# Patient Record
Sex: Female | Born: 1970 | Race: White | Hispanic: No | Smoking: Current every day smoker
Health system: Southern US, Community
[De-identification: ages and names within clinical notes are randomized; demographics above are authoritative.]

## PROBLEM LIST (undated history)

## (undated) DIAGNOSIS — M549 Dorsalgia, unspecified: Secondary | ICD-10-CM

## (undated) HISTORY — PX: OTHER SURGICAL HISTORY: SHX169

## (undated) HISTORY — DX: Dorsalgia, unspecified: M54.9

## (undated) HISTORY — PX: APPENDECTOMY: SHX54

---

## 1997-08-08 ENCOUNTER — Other Ambulatory Visit: Admission: RE | Admit: 1997-08-08 | Discharge: 1997-08-08 | Payer: Self-pay | Admitting: Obstetrics & Gynecology

## 1997-10-29 ENCOUNTER — Other Ambulatory Visit: Admission: RE | Admit: 1997-10-29 | Discharge: 1997-10-29 | Payer: Self-pay | Admitting: Obstetrics & Gynecology

## 1998-11-06 ENCOUNTER — Other Ambulatory Visit: Admission: RE | Admit: 1998-11-06 | Discharge: 1998-11-06 | Payer: Self-pay | Admitting: Obstetrics & Gynecology

## 1999-11-12 ENCOUNTER — Other Ambulatory Visit: Admission: RE | Admit: 1999-11-12 | Discharge: 1999-11-12 | Payer: Self-pay | Admitting: Obstetrics & Gynecology

## 2000-11-22 ENCOUNTER — Other Ambulatory Visit: Admission: RE | Admit: 2000-11-22 | Discharge: 2000-11-22 | Payer: Self-pay | Admitting: Obstetrics & Gynecology

## 2001-12-03 ENCOUNTER — Other Ambulatory Visit: Admission: RE | Admit: 2001-12-03 | Discharge: 2001-12-03 | Payer: Self-pay | Admitting: Obstetrics & Gynecology

## 2002-12-06 ENCOUNTER — Other Ambulatory Visit: Admission: RE | Admit: 2002-12-06 | Discharge: 2002-12-06 | Payer: Self-pay | Admitting: Obstetrics & Gynecology

## 2003-06-13 ENCOUNTER — Inpatient Hospital Stay (HOSPITAL_COMMUNITY): Admission: AD | Admit: 2003-06-13 | Discharge: 2003-06-18 | Payer: Self-pay | Admitting: Obstetrics and Gynecology

## 2003-11-26 ENCOUNTER — Emergency Department (HOSPITAL_COMMUNITY): Admission: EM | Admit: 2003-11-26 | Discharge: 2003-11-26 | Payer: Self-pay | Admitting: Emergency Medicine

## 2003-12-09 ENCOUNTER — Other Ambulatory Visit: Admission: RE | Admit: 2003-12-09 | Discharge: 2003-12-09 | Payer: Self-pay | Admitting: Obstetrics & Gynecology

## 2004-12-29 ENCOUNTER — Other Ambulatory Visit: Admission: RE | Admit: 2004-12-29 | Discharge: 2004-12-29 | Payer: Self-pay | Admitting: Obstetrics & Gynecology

## 2005-07-06 ENCOUNTER — Inpatient Hospital Stay (HOSPITAL_COMMUNITY): Admission: AD | Admit: 2005-07-06 | Discharge: 2005-07-08 | Payer: Self-pay | Admitting: Obstetrics and Gynecology

## 2005-08-08 ENCOUNTER — Other Ambulatory Visit: Admission: RE | Admit: 2005-08-08 | Discharge: 2005-08-08 | Payer: Self-pay | Admitting: Obstetrics & Gynecology

## 2005-12-05 ENCOUNTER — Ambulatory Visit: Payer: Self-pay | Admitting: Family Medicine

## 2006-01-06 ENCOUNTER — Ambulatory Visit: Payer: Self-pay | Admitting: Family Medicine

## 2006-07-28 DIAGNOSIS — S322XXA Fracture of coccyx, initial encounter for closed fracture: Secondary | ICD-10-CM

## 2006-07-28 DIAGNOSIS — S3210XA Unspecified fracture of sacrum, initial encounter for closed fracture: Secondary | ICD-10-CM

## 2006-07-28 HISTORY — DX: Unspecified fracture of sacrum, initial encounter for closed fracture: S32.10XA

## 2012-05-11 ENCOUNTER — Ambulatory Visit (INDEPENDENT_AMBULATORY_CARE_PROVIDER_SITE_OTHER): Payer: BC Managed Care – PPO | Admitting: Family Medicine

## 2012-05-11 ENCOUNTER — Encounter: Payer: Self-pay | Admitting: Family Medicine

## 2012-05-11 VITALS — BP 130/80 | HR 90 | Temp 98.5°F | Ht 64.75 in | Wt 164.8 lb

## 2012-05-11 DIAGNOSIS — Z8249 Family history of ischemic heart disease and other diseases of the circulatory system: Secondary | ICD-10-CM

## 2012-05-11 DIAGNOSIS — Z720 Tobacco use: Secondary | ICD-10-CM

## 2012-05-11 DIAGNOSIS — R0982 Postnasal drip: Secondary | ICD-10-CM

## 2012-05-11 DIAGNOSIS — F17200 Nicotine dependence, unspecified, uncomplicated: Secondary | ICD-10-CM

## 2012-05-11 HISTORY — DX: Tobacco use: Z72.0

## 2012-05-11 HISTORY — DX: Postnasal drip: R09.82

## 2012-05-11 HISTORY — DX: Family history of ischemic heart disease and other diseases of the circulatory system: Z82.49

## 2012-05-11 LAB — CBC WITH DIFFERENTIAL/PLATELET
Basophils Relative: 0.3 % (ref 0.0–3.0)
Eosinophils Relative: 1.7 % (ref 0.0–5.0)
HCT: 44 % (ref 36.0–46.0)
Lymphs Abs: 1.3 10*3/uL (ref 0.7–4.0)
Monocytes Relative: 2.9 % — ABNORMAL LOW (ref 3.0–12.0)
Neutrophils Relative %: 79.8 % — ABNORMAL HIGH (ref 43.0–77.0)
Platelets: 192 10*3/uL (ref 150.0–400.0)
RBC: 4.75 Mil/uL (ref 3.87–5.11)
WBC: 8.7 10*3/uL (ref 4.5–10.5)

## 2012-05-11 LAB — HEPATIC FUNCTION PANEL
AST: 16 U/L (ref 0–37)
Albumin: 4.1 g/dL (ref 3.5–5.2)
Alkaline Phosphatase: 61 U/L (ref 39–117)
Total Protein: 7 g/dL (ref 6.0–8.3)

## 2012-05-11 LAB — BASIC METABOLIC PANEL
CO2: 23 mEq/L (ref 19–32)
Glucose, Bld: 141 mg/dL — ABNORMAL HIGH (ref 70–99)
Potassium: 3.6 mEq/L (ref 3.5–5.1)
Sodium: 139 mEq/L (ref 135–145)

## 2012-05-11 LAB — LIPID PANEL: Total CHOL/HDL Ratio: 5

## 2012-05-11 NOTE — Progress Notes (Signed)
  Subjective:    Patient ID: Sara Hunter, female    DOB: [birth year], 42 y.o.   MRN: [id number]  HPI New to establish.  Previous [place]- [place], then moved to [place].  GYNArlyce Dice, UTD on pap/mammo.  Tobacco use- current smoker, currently 1 ppd.  Has used Chantix in the past (2-3x) but will stop taking meds when it makes her sick to smoke.  Will wake in the morning and smoke 1st thing.  Started smoking at age 78.  Friends and family also smoke.  Not currently working so will smoke when bored.  Has 85 and 51 yr old daughters that she would like to quit for.  Cough- has increased over the last 2-3 weeks.  Now having L sided breast and rib pain due to severity and frequency of cough.  Some chest tightness, no SOB.  Pt reports due to smoking has constant low level cough.  + family hx of COPD (mother).  No hx of wheezing.  Current cough is worse at night.  No hx of asthma.  CAD- + family hx (father).  Has not had recent lab work done.   Review of Systems For ROS see HPI     Objective:   Physical Exam  Vitals reviewed. Constitutional: She appears well-developed and well-nourished. No distress.  HENT:  Head: Normocephalic and atraumatic.  Right Ear: Tympanic membrane normal.  Left Ear: Tympanic membrane normal.  Nose: Mucosal edema and rhinorrhea present. Right sinus exhibits no maxillary sinus tenderness and no frontal sinus tenderness. Left sinus exhibits no maxillary sinus tenderness and no frontal sinus tenderness.  Mouth/Throat: Mucous membranes are normal. Posterior oropharyngeal erythema (w/ PND) present.  Eyes: Conjunctivae normal and EOM are normal. Pupils are equal, round, and reactive to light.  Neck: Normal range of motion. Neck supple.  Cardiovascular: Normal rate, regular rhythm and normal heart sounds.   Pulmonary/Chest: Effort normal and breath sounds normal. No respiratory distress. She has no wheezes. She has no rales.  Lymphadenopathy:    She has no cervical  adenopathy.  Skin: Skin is warm and dry.  Psychiatric: She has a normal mood and affect. Her behavior is normal. Thought content normal.          Assessment & Plan:

## 2012-05-11 NOTE — Patient Instructions (Addendum)
Follow up in 1 month to recheck smoking Start investigating triggers for your smoking and coming up w/ substitutes Money and convenience are strong motivators! We'll notify you of your lab results and make any changes if needed Start OTC Claritin or Zyrtec daily to decrease post nasal drip Mucinex to thin your congestion Ibuprofen for chest wall pain Call with any questions or concerns Welcome!  We're glad to have you!!!

## 2012-05-16 LAB — VITAMIN D 1,25 DIHYDROXY
Vitamin D 1, 25 (OH)2 Total: 58 pg/mL (ref 18–72)
Vitamin D2 1, 25 (OH)2: <8 pg/mL
Vitamin D3 1, 25 (OH)2: 58 pg/mL

## 2012-05-22 ENCOUNTER — Telehealth: Payer: Self-pay | Admitting: *Deleted

## 2012-05-22 NOTE — Telephone Encounter (Signed)
Spoke with the pt and informed her of recent lab results and note.  Pt understood and agreed.  Pt stated that she was fasting that am.//AB/CMA

## 2012-05-22 NOTE — Telephone Encounter (Signed)
Message copied by Verdie Shire on Tue May 22, 2012  8:38 AM ------      Message from: Sheliah Hatch      Created: Fri May 11, 2012  1:23 PM       Labs look good except glucose was high.  Please ask pt if she was fasting this AM.

## 2012-05-22 NOTE — Telephone Encounter (Signed)
Spoke with the pt and informed her that Dr. Beverely Low would like for her to have an HgbA1c for elevated BS.  Pt agreed and lab appt was scheduled.//AB/CMA

## 2012-05-22 NOTE — Telephone Encounter (Signed)
Based on elevated glucose- pt will need to return for A1C (dx hyperglycemia)

## 2012-05-22 NOTE — Telephone Encounter (Signed)
LM @ (3:36pm) asking the pt to RTC.//AB/CMA

## 2012-05-24 ENCOUNTER — Ambulatory Visit (INDEPENDENT_AMBULATORY_CARE_PROVIDER_SITE_OTHER): Payer: BC Managed Care – PPO | Admitting: Family Medicine

## 2012-05-24 ENCOUNTER — Encounter: Payer: Self-pay | Admitting: Family Medicine

## 2012-05-24 ENCOUNTER — Other Ambulatory Visit (INDEPENDENT_AMBULATORY_CARE_PROVIDER_SITE_OTHER): Payer: BC Managed Care – PPO

## 2012-05-24 VITALS — BP 110/80 | HR 101 | Temp 98.4°F | Ht 64.75 in | Wt 163.8 lb

## 2012-05-24 DIAGNOSIS — R7309 Other abnormal glucose: Secondary | ICD-10-CM

## 2012-05-24 DIAGNOSIS — R739 Hyperglycemia, unspecified: Secondary | ICD-10-CM

## 2012-05-24 DIAGNOSIS — R52 Pain, unspecified: Secondary | ICD-10-CM

## 2012-05-24 DIAGNOSIS — J111 Influenza due to unidentified influenza virus with other respiratory manifestations: Secondary | ICD-10-CM | POA: Insufficient documentation

## 2012-05-24 LAB — HEMOGLOBIN A1C: Hgb A1c MFr Bld: 5.6 % (ref 4.6–6.5)

## 2012-05-24 MED ORDER — OSELTAMIVIR PHOSPHATE 75 MG PO CAPS: 75.0000 mg | ORAL_CAPSULE | Freq: Two times a day (BID) | ORAL | Status: DC

## 2012-05-24 NOTE — Progress Notes (Signed)
  Subjective:    Patient ID: Sara Hunter, female    DOB: 1970-08-26, 42 y.o.   MRN: [id number]  HPI Viral illness- sxs started Tuesday w/ 'scratchy throat'.  + sweats and chills.  + HAs, cough, total body aches.  No documented fevers.  + N/V/D.  + sick contacts.  + tooth pain, facial pain.   Review of Systems For ROS see HPI     Objective:   Physical Exam  Vitals reviewed. Constitutional: She appears well-developed and well-nourished. No distress.       Obviously not feeling well  HENT:  Head: Normocephalic and atraumatic.  Right Ear: Tympanic membrane normal.  Left Ear: Tympanic membrane normal.  Nose: Mucosal edema and rhinorrhea present. Right sinus exhibits no maxillary sinus tenderness and no frontal sinus tenderness. Left sinus exhibits no maxillary sinus tenderness and no frontal sinus tenderness.  Mouth/Throat: Uvula is midline and mucous membranes are normal. Posterior oropharyngeal erythema present. No oropharyngeal exudate.  Eyes: Conjunctivae normal and EOM are normal. Pupils are equal, round, and reactive to light.  Neck: Normal range of motion. Neck supple.  Cardiovascular: Normal rate, regular rhythm and normal heart sounds.   Pulmonary/Chest: Effort normal and breath sounds normal. No respiratory distress. She has no wheezes.       + dry cough  Lymphadenopathy:    She has no cervical adenopathy.  Skin: Skin is warm.          Assessment & Plan:

## 2012-05-24 NOTE — Assessment & Plan Note (Signed)
New.  Pt's flu test +.  Start Tamiflu.  No evidence of bacterial infxn that would require abx.  Reviewed supportive care and red flags that should prompt return.  Pt expressed understanding and is in agreement w/ plan.

## 2012-05-24 NOTE — Patient Instructions (Addendum)
This is the flu Start the Tamiflu twice daily Drink plenty of fluids Alternate tylenol and ibuprofen every 4 hrs for fever and body aches REST! Call your pediatrician and get the girls on Tamiflu Call with any questions or concerns Hang in there!!!

## 2012-05-27 NOTE — Assessment & Plan Note (Addendum)
New.  Check labs to risk stratify.  Strongly encouraged her to quit smoking.  Will discuss referral to cards for stress test at upcoming appt

## 2012-05-27 NOTE — Assessment & Plan Note (Signed)
New.  This is likely the cause of pt's current cough that worsens at night.  Start OTC antihistamine.  mucinex to thin congestion.  Ibuprofen for chest wall pain.  Reviewed supportive care and red flags that should prompt return.  Pt expressed understanding and is in agreement w/ plan.

## 2012-05-27 NOTE — Assessment & Plan Note (Signed)
New to provider, ongoing for pt.  Pt reports that Chantix works until she decides she wants to smoke and then stops taking it.  Pt considering hypnotism.  Will follow closely.

## 2012-07-09 ENCOUNTER — Ambulatory Visit: Payer: Self-pay | Admitting: Family Medicine

## 2012-10-10 ENCOUNTER — Ambulatory Visit (INDEPENDENT_AMBULATORY_CARE_PROVIDER_SITE_OTHER): Payer: BC Managed Care – PPO | Admitting: Surgery

## 2012-10-19 ENCOUNTER — Encounter (INDEPENDENT_AMBULATORY_CARE_PROVIDER_SITE_OTHER): Payer: Self-pay | Admitting: Surgery

## 2012-11-22 ENCOUNTER — Telehealth (INDEPENDENT_AMBULATORY_CARE_PROVIDER_SITE_OTHER): Payer: Self-pay | Admitting: Surgery

## 2012-11-22 ENCOUNTER — Encounter (INDEPENDENT_AMBULATORY_CARE_PROVIDER_SITE_OTHER): Payer: Self-pay | Admitting: Surgery

## 2012-11-22 ENCOUNTER — Ambulatory Visit (INDEPENDENT_AMBULATORY_CARE_PROVIDER_SITE_OTHER): Payer: BC Managed Care – PPO | Admitting: Surgery

## 2012-11-22 ENCOUNTER — Encounter (INDEPENDENT_AMBULATORY_CARE_PROVIDER_SITE_OTHER): Payer: Self-pay

## 2012-11-22 VITALS — BP 118/68 | HR 78 | Temp 97.3°F | Resp 15 | Ht 64.0 in | Wt 166.0 lb

## 2012-11-22 DIAGNOSIS — L732 Hidradenitis suppurativa: Secondary | ICD-10-CM

## 2012-11-22 HISTORY — DX: Hidradenitis suppurativa: L73.2

## 2012-11-22 NOTE — Telephone Encounter (Signed)
Discussed pt financial responsibilities and placed in pending folder. °

## 2012-11-22 NOTE — Patient Instructions (Signed)
Hidradenitis Suppurativa, Sweat Gland Abscess °Hidradenitis suppurativa is a long lasting (chronic), uncommon disease of the sweat glands. With this, boil-like lumps and scarring develop in the groin, some times under the arms (axillae), and under the breasts. It may also uncommonly occur behind the ears, in the crease of the buttocks, and around the genitals.  °CAUSES  °The cause is from a blocking of the sweat glands. They then become infected. It may cause drainage and odor. It is not contagious. So it cannot be given to someone else. It most often shows up in puberty (about 10 to 44 years of age). But it may happen much later. It is similar to acne which is a disease of the sweat glands. This condition is slightly more common in African-Americans and women. °SYMPTOMS  °· Hidradenitis usually starts as one or more red, tender, swellings in the groin or under the arms (axilla). °· Over a period of hours to days the lesions get larger. They often open to the skin surface, draining clear to yellow-colored fluid. °· The infected area heals with scarring. °DIAGNOSIS  °Your caregiver makes this diagnosis by looking at you. Sometimes cultures (growing germs on plates in the lab) may be taken. This is to see what germ (bacterium) is causing the infection.  °TREATMENT  °· Topical germ killing medicine applied to the skin (antibiotics) are the treatment of choice. Antibiotics taken by mouth (systemic) are sometimes needed when the condition is getting worse or is severe. °· Avoid tight-fitting clothing which traps moisture in. °· Dirt does not cause hidradenitis and it is not caused by poor hygiene. °· Involved areas should be cleaned daily using an antibacterial soap. Some patients find that the liquid form of Lever 2000®, applied to the involved areas as a lotion after bathing, can help reduce the odor related to this condition. °· Sometimes surgery is needed to drain infected areas or remove scarred tissue. Removal of  large amounts of tissue is used only in severe cases. °· Birth control pills may be helpful. °· Oral retinoids (vitamin A derivatives) for 6 to 12 months which are effective for acne may also help this condition. °· Weight loss will improve but not cure hidradenitis. It is made worse by being overweight. But the condition is not caused by being overweight. °· This condition is more common in people who have had acne. °· It may become worse under stress. °There is no medical cure for hidradenitis. It can be controlled, but not cured. The condition usually continues for years with periods of getting worse and getting better (remission). °Document Released: 11/24/2003 Document Revised: 07/04/2011 Document Reviewed: 12/10/2007 °[employer]® Patient Information ©2014 [employer]. ° °

## 2012-11-22 NOTE — Progress Notes (Signed)
Chief Complaint:  Recurrent drainage and discomfort in right groin hidradenitis site  History of Present Illness:  Sara Hunter is an 42 y.o. female who has had recurrent hidradenitis in her groin more so on the right comes in today because of intermittent drainage and irritation from a site near the right mons pubis. We discussed management and she would like to have this area removed realizing there is a chance of recurrent infection. Would schedule this as an outpatient at [facility] day surgery under light general. She wants to proceed.  History reviewed. No pertinent past medical history.  Past Surgical History  Procedure Laterality Date  . Appendectomy    . Cyst remover on tailbone      Current Outpatient Prescriptions  Medication Sig Dispense Refill  . medroxyPROGESTERone (DEPO-PROVERA) 150 MG/ML injection Inject 150 mg into the muscle every 3 (three) months.       No current facility-administered medications for this visit.   Aspirin Family History  Problem Relation Age of Onset  . Hypertension Mother   . Diabetes Mother   . COPD Mother   . Hypertension Father   . Heart disease Father   . Hypertension Sister   . Diabetes Sister    Social History:   reports that she has been smoking Cigarettes.  She has been smoking about 1.00 pack per day. She has never used smokeless tobacco. She reports that  drinks alcohol. She reports that she does not use illicit drugs.   REVIEW OF SYSTEMS - PERTINENT POSITIVES ONLY: noncontributory  Physical Exam:   Blood pressure 118/68, pulse 78, temperature 97.3 F (36.3 C), temperature source Temporal, resp. rate 15, height 5\' 4"  (1.626 m), weight 166 lb (75.297 kg). Body mass index is 28.48 kg/(m^2).  Gen:  WDWN WF NAD  Neurological: Alert and oriented to person, place, and time. Motor and sensory function is grossly intact  Head: Normocephalic and atraumatic.  Eyes: Conjunctivae are normal. Pupils are equal, round, and reactive to light.  No scleral icterus.  Neck: Normal range of motion. Neck supple. No tracheal deviation or thyromegaly present.  Cardiovascular:  SR without murmurs or gallops.  No carotid bruits Respiratory: Effort normal.  No respiratory distress. No chest wall tenderness. Breath sounds normal.  No wheezes, rales or rhonchi.  Abdomen:  nontender GU: scarring and firmness in the right mons pubis with nodule Musculoskeletal: Normal range of motion. Extremities are nontender. No cyanosis, edema or clubbing noted Lymphadenopathy: No cervical, preauricular, postauricular or axillary adenopathy is present Skin: Skin is warm and dry. No rash noted. No diaphoresis. No erythema. No pallor. Pscyh: Normal mood and affect. Behavior is normal. Judgment and thought content normal.   LABORATORY RESULTS: No results found for this or any previous visit (from the past 48 hour(s)).  RADIOLOGY RESULTS: No results found.  Problem List: Patient Active Problem List   Diagnosis Date Noted  . Influenza caused by unspecified influenza virus 05/24/2012  . Tobacco use 05/11/2012  . Post-nasal drip 05/11/2012  . Family history of early CAD 05/11/2012  . FRACTURE, COCCYX 07/28/2006    Assessment & Plan: Hidradenitis suppurativa-chronic Plan:  Excision and closure    Matt B. Daphine Deutscher, MD, [facility] [phone] beeper [phone]  11/22/2012 9:17 AM

## 2012-11-23 LAB — HM MAMMOGRAPHY: HM MAMMO: NORMAL

## 2012-11-23 LAB — HM PAP SMEAR: HM PAP: NORMAL

## 2013-07-24 ENCOUNTER — Encounter: Payer: Self-pay | Admitting: Nurse Practitioner

## 2013-07-24 ENCOUNTER — Ambulatory Visit (INDEPENDENT_AMBULATORY_CARE_PROVIDER_SITE_OTHER): Payer: BC Managed Care – PPO | Admitting: Nurse Practitioner

## 2013-07-24 ENCOUNTER — Telehealth: Payer: Self-pay | Admitting: Family Medicine

## 2013-07-24 ENCOUNTER — Ambulatory Visit: Payer: BC Managed Care – PPO | Admitting: Family Medicine

## 2013-07-24 DIAGNOSIS — J069 Acute upper respiratory infection, unspecified: Secondary | ICD-10-CM

## 2013-07-24 DIAGNOSIS — L0100 Impetigo, unspecified: Secondary | ICD-10-CM

## 2013-07-24 MED ORDER — MUPIROCIN 2 % EX OINT: 1.0000 "application " | TOPICAL_OINTMENT | Freq: Two times a day (BID) | CUTANEOUS | Status: DC

## 2013-07-24 NOTE — Telephone Encounter (Signed)
Relevant patient education mailed to patient.  

## 2013-07-24 NOTE — Patient Instructions (Signed)
You have a cold virus causing your symptoms. The average duration of cold symptoms is 14 days. Start daily sinus rinses (neilmed Sinus Rinse). Use 30 mg to 60 mg pseudoephedrine twice daily. Sip fluids every hour. Rest. If you are not feeling better in 1 week or develop fever or chest pain, call us for re-evaluation. For ear: wash twice daily with hydrogen peroxide followed by mild soap-Dove bar soap. Stop using peroxide once there is no crusting. Use q-tip to get into ear fold. Dry throughly. Apply mupirocin twice daily.  Feel better!  Upper Respiratory Infection, Adult An upper respiratory infection (URI) is also sometimes known as the common cold. The upper respiratory tract includes the nose, sinuses, throat, trachea, and bronchi. Bronchi are the airways leading to the lungs. Most people improve within 1 week, but symptoms can last up to 2 weeks. A residual cough may last even longer.  CAUSES Many different viruses can infect the tissues lining the upper respiratory tract. The tissues become irritated and inflamed and often become very moist. Mucus production is also common. A cold is contagious. You can easily spread the virus to others by oral contact. This includes kissing, sharing a glass, coughing, or sneezing. Touching your mouth or nose and then touching a surface, which is then touched by another person, can also spread the virus. SYMPTOMS  Symptoms typically develop 1 to 3 days after you come in contact with a cold virus. Symptoms vary from person to person. They may include:  Runny nose.  Sneezing.  Nasal congestion.  Sinus irritation.  Sore throat.  Loss of voice (laryngitis).  Cough.  Fatigue.  Muscle aches.  Loss of appetite.  Headache.  Low-grade fever. DIAGNOSIS  You might diagnose your own cold based on familiar symptoms, since most people get a cold 2 to 3 times a year. Your caregiver can confirm this based on your exam. Most importantly, your caregiver can  check that your symptoms are not due to another disease such as strep throat, sinusitis, pneumonia, asthma, or epiglottitis. Blood tests, throat tests, and X-rays are not necessary to diagnose a common cold, but they may sometimes be helpful in excluding other more serious diseases. Your caregiver will decide if any further tests are required. RISKS AND COMPLICATIONS  You may be at risk for a more severe case of the common cold if you smoke cigarettes, have chronic heart disease (such as heart failure) or lung disease (such as asthma), or if you have a weakened immune system. The very young and very old are also at risk for more serious infections. Bacterial sinusitis, middle ear infections, and bacterial pneumonia can complicate the common cold. The common cold can worsen asthma and chronic obstructive pulmonary disease (COPD). Sometimes, these complications can require emergency medical care and may be life-threatening. PREVENTION  The best way to protect against getting a cold is to practice good hygiene. Avoid oral or hand contact with people with cold symptoms. Wash your hands often if contact occurs. There is no clear evidence that vitamin C, vitamin E, echinacea, or exercise reduces the chance of developing a cold. However, it is always recommended to get plenty of rest and practice good nutrition. TREATMENT  Treatment is directed at relieving symptoms. There is no cure. Antibiotics are not effective, because the infection is caused by a virus, not by bacteria. Treatment may include:  Increased fluid intake. Sports drinks offer valuable electrolytes, sugars, and fluids.  Breathing heated mist or steam (vaporizer or shower).  Eating chicken soup or other clear broths, and maintaining good nutrition.  Getting plenty of rest.  Using gargles or lozenges for comfort.  Controlling fevers with ibuprofen or acetaminophen as directed by your caregiver.  Increasing usage of your inhaler if you have  asthma. Zinc gel and zinc lozenges, taken in the first 24 hours of the common cold, can shorten the duration and lessen the severity of symptoms. Pain medicines may help with fever, muscle aches, and throat pain. A variety of non-prescription medicines are available to treat congestion and runny nose. Your caregiver can make recommendations and may suggest nasal or lung inhalers for other symptoms.  HOME CARE INSTRUCTIONS   Only take over-the-counter or prescription medicines for pain, discomfort, or fever as directed by your caregiver.  Use a warm mist humidifier or inhale steam from a shower to increase air moisture. This may keep secretions moist and make it easier to breathe.  Drink enough water and fluids to keep your urine clear or pale yellow.  Rest as needed.  Return to work when your temperature has returned to normal or as your caregiver advises. You may need to stay home longer to avoid infecting others. You can also use a face mask and careful hand washing to prevent spread of the virus. SEEK MEDICAL CARE IF:   After the first few days, you feel you are getting worse rather than better.  You need your caregiver's advice about medicines to control symptoms.  You develop chills, worsening shortness of breath, or brown or red sputum. These may be signs of pneumonia.  You develop yellow or brown nasal discharge or pain in the face, especially when you bend forward. These may be signs of sinusitis.  You develop a fever, swollen neck glands, pain with swallowing, or white areas in the back of your throat. These may be signs of strep throat. SEEK IMMEDIATE MEDICAL CARE IF:   You have a fever.  You develop severe or persistent headache, ear pain, sinus pain, or chest pain.  You develop wheezing, a prolonged cough, cough up blood, or have a change in your usual mucus (if you have chronic lung disease).  You develop sore muscles or a stiff neck. Document Released: 10/05/2000  Document Revised: 07/04/2011 Document Reviewed: 08/13/2010 [facility] Patient Information 2014 [place], [place].

## 2013-07-24 NOTE — Progress Notes (Signed)
   Subjective:    Patient ID: Sara Hunter, female    DOB: [birth year], 43 y.o.   MRN: [id number]  HPI Comments: Pt also is concerned about crusted lesion at R ear lobe. Noticed few days ago, no pain, but "glands" behind R ear swollen few days ago.   URI  This is a new problem. The current episode started 1 to 4 weeks ago (1 wk). The problem has been unchanged. There has been no fever. Associated symptoms include congestion, coughing, headaches, a rash, sinus pain, a sore throat and swollen glands (R ear). Pertinent negatives include no abdominal pain, chest pain, diarrhea, ear pain, nausea, vomiting or wheezing. She has tried decongestant and acetaminophen for the symptoms. The treatment provided significant relief.      Review of Systems  Constitutional: Negative for fever, chills, activity change, appetite change and fatigue.  HENT: Positive for congestion, sinus pressure and sore throat. Negative for ear pain and voice change.   Respiratory: Positive for cough. Negative for chest tightness, shortness of breath and wheezing.   Cardiovascular: Negative for chest pain.  Gastrointestinal: Negative for nausea, vomiting, abdominal pain and diarrhea.  Musculoskeletal: Negative for back pain.  Skin: Positive for rash.  Neurological: Positive for headaches.  Hematological: Positive for adenopathy.       Objective:   Physical Exam  Vitals reviewed. Constitutional: She is oriented to person, place, and time. She appears well-developed and well-nourished. No distress.  HENT:  Head: Normocephalic and atraumatic.  Right Ear: Tympanic membrane normal.  Left Ear: Tympanic membrane and external ear normal.  Ears:  Mouth/Throat: Oropharynx is clear and moist. No oropharyngeal exudate.  Eyes: Conjunctivae are normal. Right eye exhibits no discharge. Left eye exhibits no discharge.  Neck: Normal range of motion. Neck supple. No thyromegaly present.  Cardiovascular: Normal rate, regular  rhythm and normal heart sounds.   No murmur heard. Pulmonary/Chest: Effort normal and breath sounds normal. No respiratory distress. She has no wheezes. She has no rales.  Lymphadenopathy:    She has no cervical adenopathy.  Neurological: She is alert and oriented to person, place, and time.  Skin: Skin is warm and dry.  See ear  Psychiatric: She has a normal mood and affect. Her behavior is normal. Judgment and thought content normal.          Assessment & Plan:  1. Impetigo R ear lobe - mupirocin ointment (BACTROBAN) 2 %; Place 1 application into the nose 2 (two) times daily.  Dispense: 22 g; Refill: 0 See instructions for skin care.  2. Upper respiratory infection Likely viral Sinus rinses Pseudoephedrine See pt instructions. F/u PRN

## 2013-07-24 NOTE — Progress Notes (Signed)
Pre-visit discussion using our clinic review tool. No additional management support is needed unless otherwise documented below in the visit note.  

## 2013-08-02 ENCOUNTER — Telehealth: Payer: Self-pay | Admitting: General Practice

## 2013-08-02 ENCOUNTER — Encounter: Payer: Self-pay | Admitting: Family Medicine

## 2013-08-02 ENCOUNTER — Ambulatory Visit (INDEPENDENT_AMBULATORY_CARE_PROVIDER_SITE_OTHER): Payer: BC Managed Care – PPO | Admitting: Family Medicine

## 2013-08-02 ENCOUNTER — Ambulatory Visit: Payer: BC Managed Care – PPO

## 2013-08-02 VITALS — BP 112/78 | HR 90 | Temp 98.2°F | Resp 16 | Ht 64.5 in | Wt 182.0 lb

## 2013-08-02 DIAGNOSIS — Z0184 Encounter for antibody response examination: Secondary | ICD-10-CM

## 2013-08-02 DIAGNOSIS — Z0000 Encounter for general adult medical examination without abnormal findings: Secondary | ICD-10-CM | POA: Insufficient documentation

## 2013-08-02 DIAGNOSIS — Z111 Encounter for screening for respiratory tuberculosis: Secondary | ICD-10-CM

## 2013-08-02 DIAGNOSIS — R946 Abnormal results of thyroid function studies: Secondary | ICD-10-CM

## 2013-08-02 HISTORY — DX: Encounter for general adult medical examination without abnormal findings: Z00.00

## 2013-08-02 LAB — BASIC METABOLIC PANEL
BUN: 13 mg/dL (ref 6–23)
CHLORIDE: 105 meq/L (ref 96–112)
CO2: 25 meq/L (ref 19–32)
CREATININE: 0.6 mg/dL (ref 0.4–1.2)
Calcium: 9.1 mg/dL (ref 8.4–10.5)
GFR: 106.92 mL/min (ref >60.00)
Glucose, Bld: 89 mg/dL (ref 70–99)
POTASSIUM: 3.9 meq/L (ref 3.5–5.1)
Sodium: 138 mEq/L (ref 135–145)

## 2013-08-02 LAB — LIPID PANEL
CHOLESTEROL: 177 mg/dL (ref 0–200)
HDL: 36.9 mg/dL — ABNORMAL LOW (ref >39.00)
LDL CALC: 119 mg/dL — AB (ref 0–99)
TRIGLYCERIDES: 108 mg/dL (ref 0.0–149.0)
Total CHOL/HDL Ratio: 5
VLDL: 21.6 mg/dL (ref 0.0–40.0)

## 2013-08-02 LAB — CBC WITH DIFFERENTIAL/PLATELET
BASOS ABS: 0.1 10*3/uL (ref 0.0–0.1)
Basophils Relative: 0.8 % (ref 0.0–3.0)
Eosinophils Absolute: 0.1 10*3/uL (ref 0.0–0.7)
Eosinophils Relative: 1.9 % (ref 0.0–5.0)
HEMATOCRIT: 45.2 % (ref 36.0–46.0)
Hemoglobin: 15.5 g/dL — ABNORMAL HIGH (ref 12.0–15.0)
LYMPHS ABS: 1.7 10*3/uL (ref 0.7–4.0)
Lymphocytes Relative: 21.7 % (ref 12.0–46.0)
MCHC: 34.2 g/dL (ref 30.0–36.0)
MCV: 93.2 fl (ref 78.0–100.0)
MONO ABS: 0.4 10*3/uL (ref 0.1–1.0)
Monocytes Relative: 4.8 % (ref 3.0–12.0)
NEUTROS PCT: 70.8 % (ref 43.0–77.0)
Neutro Abs: 5.4 10*3/uL (ref 1.4–7.7)
PLATELETS: 215 10*3/uL (ref 150.0–400.0)
RBC: 4.86 Mil/uL (ref 3.87–5.11)
RDW: 13.1 % (ref 11.5–14.6)
WBC: 7.7 10*3/uL (ref 4.5–10.5)

## 2013-08-02 LAB — HEPATIC FUNCTION PANEL
ALBUMIN: 4.2 g/dL (ref 3.5–5.2)
ALK PHOS: 58 U/L (ref 39–117)
ALT: 21 U/L (ref 0–35)
AST: 19 U/L (ref 0–37)
Bilirubin, Direct: 0.1 mg/dL (ref 0.0–0.3)
Total Bilirubin: 0.9 mg/dL (ref 0.3–1.2)
Total Protein: 6.8 g/dL (ref 6.0–8.3)

## 2013-08-02 LAB — T4, FREE: FREE T4: 0.86 ng/dL (ref 0.60–1.60)

## 2013-08-02 LAB — TSH: TSH: 0.34 u[IU]/mL — AB (ref 0.35–5.50)

## 2013-08-02 LAB — T3, FREE: T3 FREE: 3.2 pg/mL (ref 2.3–4.2)

## 2013-08-02 NOTE — Assessment & Plan Note (Signed)
Pt's PE WNL.  UTD on GYN.  Check labs.  Encouraged smoking cessation.  Anticipatory guidance provided.

## 2013-08-02 NOTE — Progress Notes (Signed)
   Subjective:    Patient ID: Sara Hunter, female    DOB: 20-Mar-1971, 43 y.o.   MRN: [id number]  HPI CPE- no concerns today   Review of Systems Patient reports no vision/ hearing changes, adenopathy,fever, weight change,  persistant/recurrent hoarseness , swallowing issues, chest pain, palpitations, edema, persistant/recurrent cough, hemoptysis, dyspnea (rest/exertional/paroxysmal nocturnal), gastrointestinal bleeding (melena, rectal bleeding), abdominal pain, significant heartburn, bowel changes, GU symptoms (dysuria, hematuria, incontinence), Gyn symptoms (abnormal  bleeding, pain),  syncope, focal weakness, memory loss, numbness & tingling, skin/hair/nail changes, abnormal bruising or bleeding, anxiety, or depression.     Objective:   Physical Exam General Appearance:    Alert, cooperative, no distress, appears stated age  Head:    Normocephalic, without obvious abnormality, atraumatic  Eyes:    PERRL, conjunctiva/corneas clear, EOM's intact, fundi    benign, both eyes  Ears:    Normal TM's and external ear canals, both ears  Nose:   Nares normal, septum midline, mucosa normal, no drainage    or sinus tenderness  Throat:   Lips, mucosa, and tongue normal; teeth and gums normal  Neck:   Supple, symmetrical, trachea midline, no adenopathy;    Thyroid: no enlargement/tenderness/nodules  Back:     Symmetric, no curvature, ROM normal, no CVA tenderness  Lungs:     Clear to auscultation bilaterally, respirations unlabored  Chest Wall:    No tenderness or deformity   Heart:    Regular rate and rhythm, S1 and S2 normal, no murmur, rub   or gallop  Breast Exam:    Deferred to GYN  Abdomen:     Soft, non-tender, bowel sounds active all four quadrants,    no masses, no organomegaly  Genitalia:    Deferred to GYN  Rectal:    Extremities:   Extremities normal, atraumatic, no cyanosis or edema  Pulses:   2+ and symmetric all extremities  Skin:   Skin color, texture, turgor normal, no  rashes or lesions  Lymph nodes:   Cervical, supraclavicular, and axillary nodes normal  Neurologic:   CNII-XII intact, normal strength, sensation and reflexes    throughout          Assessment & Plan:

## 2013-08-02 NOTE — Patient Instructions (Signed)
Follow up in 1 year, sooner if needed Start Claritin or Zyrtec daily for seasonal allergies and post-nasal drip We'll notify you of your lab results and make any changes if needed Come get your TB test read on Monday Call school or the other doctor's office and get record of your immunizations QUIT SMOKING! Call with any questions or concerns Happy Spring!!

## 2013-08-02 NOTE — Progress Notes (Signed)
Pre visit review using our clinic review tool, if applicable. No additional management support is needed unless otherwise documented below in the visit note. 

## 2013-08-02 NOTE — Telephone Encounter (Signed)
Pt gave me paperwork today for her school. Pt is awaiting [employer] and is suppose to speak with school in regards to immunizations. Will be in on Monday to get Tb read. Papers located in my top black tray.

## 2013-08-03 LAB — MEASLES/MUMPS/RUBELLA IMMUNITY
Mumps IgG: 13.5 AU/mL — ABNORMAL HIGH (ref <9.00)
Rubella: 4.49 Index — ABNORMAL HIGH (ref <0.90)
Rubeola IgG: 33.8 AU/mL — ABNORMAL HIGH (ref <25.00)

## 2013-08-03 LAB — HEPATITIS B SURFACE ANTIBODY,QUALITATIVE: Hep B S Ab: NEGATIVE

## 2013-08-03 LAB — VARICELLA ZOSTER ANTIBODY, IGG: VARICELLA IGG: 241 {index} — AB (ref <135.00)

## 2013-08-05 ENCOUNTER — Encounter: Payer: Self-pay | Admitting: General Practice

## 2013-08-05 LAB — TB SKIN TEST: TB SKIN TEST: NEGATIVE

## 2013-08-11 LAB — VITAMIN D 1,25 DIHYDROXY
VITAMIN D 1, 25 (OH) TOTAL: 95 pg/mL — AB (ref 18–72)
VITAMIN D3 1, 25 (OH): 95 pg/mL
Vitamin D2 1, 25 (OH)2: <8 pg/mL

## 2013-08-12 ENCOUNTER — Encounter: Payer: Self-pay | Admitting: General Practice

## 2013-08-12 LAB — OTHER SOLSTAS TEST

## 2013-09-10 ENCOUNTER — Ambulatory Visit (INDEPENDENT_AMBULATORY_CARE_PROVIDER_SITE_OTHER): Payer: BC Managed Care – PPO

## 2013-09-10 DIAGNOSIS — Z559 Problems related to education and literacy, unspecified: Secondary | ICD-10-CM

## 2013-09-10 DIAGNOSIS — Z0000 Encounter for general adult medical examination without abnormal findings: Secondary | ICD-10-CM

## 2013-09-10 DIAGNOSIS — Z23 Encounter for immunization: Secondary | ICD-10-CM

## 2013-09-10 LAB — POCT URINALYSIS DIPSTICK
BILIRUBIN UA: NEGATIVE
Blood, UA: NEGATIVE
GLUCOSE UA: NEGATIVE
KETONES UA: NEGATIVE
LEUKOCYTES UA: NEGATIVE
NITRITE UA: NEGATIVE
Protein, UA: NEGATIVE
Spec Grav, UA: <=1.005
Urobilinogen, UA: 1
pH, UA: 6.5

## 2013-12-12 ENCOUNTER — Ambulatory Visit (INDEPENDENT_AMBULATORY_CARE_PROVIDER_SITE_OTHER): Payer: BC Managed Care – PPO | Admitting: Podiatry

## 2013-12-12 ENCOUNTER — Encounter: Payer: Self-pay | Admitting: Podiatry

## 2013-12-12 ENCOUNTER — Ambulatory Visit (INDEPENDENT_AMBULATORY_CARE_PROVIDER_SITE_OTHER): Payer: BC Managed Care – PPO

## 2013-12-12 VITALS — BP 139/85 | HR 72 | Resp 14 | Ht 64.0 in | Wt 174.0 lb

## 2013-12-12 DIAGNOSIS — M79675 Pain in left toe(s): Secondary | ICD-10-CM

## 2013-12-12 DIAGNOSIS — M79609 Pain in unspecified limb: Secondary | ICD-10-CM

## 2013-12-12 DIAGNOSIS — L02619 Cutaneous abscess of unspecified foot: Secondary | ICD-10-CM

## 2013-12-12 DIAGNOSIS — L03119 Cellulitis of unspecified part of limb: Secondary | ICD-10-CM

## 2013-12-12 MED ORDER — TERBINAFINE HCL 250 MG PO TABS: 250.0000 mg | ORAL_TABLET | Freq: Every day | ORAL | Status: DC

## 2013-12-12 NOTE — Progress Notes (Signed)
Subjective:     Patient ID: Sara Hunter, female   DOB: 1970-10-28, 43 y.o.   MRN: [id number]  Toe Pain    patient points to between the fourth and fifth toes of the left foot stating she had a rash and that it then became a blister recently and it's been painful and her foot has been painful in the forefoot. Has been placed on Septra by urgent care and that has improved and she states that is feeling better the last couple days   Review of Systems  All other systems reviewed and are negative.      Objective:   Physical Exam  Nursing note and vitals reviewed. Constitutional: She is oriented to person, place, and time.  Cardiovascular: Intact distal pulses.   Musculoskeletal: Normal range of motion.  Neurological: She is oriented to person, place, and time.  Skin: Skin is warm and dry.   neurovascular status intact with patient found to have a blister with abscess on the fourth toe left it's localized to the lateral side of the toe and measures about 1 cm. There is some slight redness in the dorsum of the foot but it is localized with no proximal erythema edema or drainage noted and no systemic signs of infection. The area is relatively painful. Digits are found to be well perfused and subtalar midtarsal joint motion was normal as was muscle strength    Assessment:     Abscess of the fourth toe left with probable initial fungal infection leading to condition    Plan:     H&P and x-ray reviewed. Today I did a proximal nerve block and then using sterile sharp instrumentation I released the abscess tissue and cultured. It is not appear to have a purulent-type drainage and difficult to ascertain as to whether it's fungal or bacterial but we will continue her antibiotic and I placed her on Lamisil 250 mg daily for 1 month. I instructed her if any indications of systemic infection were to occur or she should develop any type of fever chills or change she is to go straight to the emergency  room if not this should heal uneventfully but she is instructed to come to [place] if it is not healed in the next week or 2

## 2013-12-12 NOTE — Progress Notes (Signed)
   Subjective:    Patient ID: Sara Hunter, female    DOB: Mar 02, 1971, 43 y.o.   MRN: [id number]  HPI Comments: Pt states began as a rash in June 2015 after a pedicure, then worsened 2 weeks ago after wearing athletic shoes.  Pt states she also trimmed off a blister prior to the area left 4, 5th toes becoming red, swollen with encapsulated pus.  Toe Pain       Review of Systems  Musculoskeletal: Positive for back pain.       Currently scheduled for surgery for 2 slipped disc in her neck.  All other systems reviewed and are negative.      Objective:   Physical Exam        Assessment & Plan:

## 2014-05-08 ENCOUNTER — Ambulatory Visit (INDEPENDENT_AMBULATORY_CARE_PROVIDER_SITE_OTHER): Payer: BLUE CROSS/BLUE SHIELD | Admitting: Family Medicine

## 2014-05-08 ENCOUNTER — Encounter: Payer: Self-pay | Admitting: General Practice

## 2014-05-08 ENCOUNTER — Other Ambulatory Visit (HOSPITAL_COMMUNITY)
Admission: RE | Admit: 2014-05-08 | Discharge: 2014-05-08 | Disposition: A | Discharge status: Home / Self Care | Payer: BLUE CROSS/BLUE SHIELD | Source: Ambulatory Visit | Attending: Family Medicine | Admitting: Family Medicine

## 2014-05-08 ENCOUNTER — Encounter: Payer: Self-pay | Admitting: Family Medicine

## 2014-05-08 VITALS — BP 120/80 | HR 78 | Temp 98.4°F | Resp 16 | Wt 169.0 lb

## 2014-05-08 DIAGNOSIS — D229 Melanocytic nevi, unspecified: Secondary | ICD-10-CM

## 2014-05-08 DIAGNOSIS — L989 Disorder of the skin and subcutaneous tissue, unspecified: Secondary | ICD-10-CM | POA: Diagnosis present

## 2014-05-08 DIAGNOSIS — L988 Other specified disorders of the skin and subcutaneous tissue: Secondary | ICD-10-CM | POA: Diagnosis not present

## 2014-05-08 HISTORY — DX: Melanocytic nevi, unspecified: D22.9

## 2014-05-08 NOTE — Progress Notes (Signed)
Pre visit review using our clinic review tool, if applicable. No additional management support is needed unless otherwise documented below in the visit note. 

## 2014-05-08 NOTE — Progress Notes (Signed)
   Subjective:    Patient ID: Sara Hunter, female    DOB: 28-Apr-1970, 44 y.o.   MRN: [id number]  HPI Mole- under R arm, pt is fearful of cutting when shaving.  Not painful if left alone.  Area is growing in size.  No color change.   Review of Systems For ROS see HPI     Objective:   Physical Exam  Constitutional: She appears well-developed and well-nourished. No distress.  Skin: Skin is warm and dry.  <0.5 cm mole/skin tag in R axilla  Vitals reviewed.         Assessment & Plan:

## 2014-05-08 NOTE — Patient Instructions (Signed)
Follow up as needed Keep area clean and pat dry after showering Apply Neosporin twice daily- cover as needed to prevent oozing onto clothes- until scab has formed Avoid deodorant directly on the area If you develop pain, surrounding redness, or drainage (other than some bloody ooze)- please call Call with any questions or concerns Happy New Year!!!

## 2014-05-08 NOTE — Assessment & Plan Note (Signed)
New to provider.  Pt reports mole is enlarging and would like it to be removed to avoid cutting it w/ razor.  Area prepped w/ betadine x2, then sprayed w/ cold spray and injected w/ 1% lido w/ epi (1 cc).  When are was appropriately numb, mole was removed w/ scissors by cutting at base.  Area was cauterized w/ silver nitrate.  Triple abx ointment applied and covered w/ band aid.  Pt tolerated procedure w/o difficulty.

## 2014-05-09 ENCOUNTER — Telehealth: Payer: Self-pay | Admitting: Family Medicine

## 2014-05-09 NOTE — Telephone Encounter (Signed)
emmi emailed °

## 2015-01-01 ENCOUNTER — Ambulatory Visit: Payer: BLUE CROSS/BLUE SHIELD | Admitting: Family Medicine

## 2015-01-02 ENCOUNTER — Encounter: Payer: Self-pay | Admitting: Family Medicine

## 2015-01-02 ENCOUNTER — Ambulatory Visit (INDEPENDENT_AMBULATORY_CARE_PROVIDER_SITE_OTHER): Payer: BLUE CROSS/BLUE SHIELD | Admitting: Family Medicine

## 2015-01-02 VITALS — BP 130/80 | HR 83 | Temp 98.0°F | Resp 16 | Wt 166.5 lb

## 2015-01-02 DIAGNOSIS — R1013 Epigastric pain: Secondary | ICD-10-CM | POA: Diagnosis not present

## 2015-01-02 DIAGNOSIS — Z720 Tobacco use: Secondary | ICD-10-CM | POA: Diagnosis not present

## 2015-01-02 LAB — CBC WITH DIFFERENTIAL/PLATELET
BASOS ABS: 0.1 10*3/uL (ref 0.0–0.1)
Basophils Relative: 0.6 % (ref 0.0–3.0)
EOS ABS: 0.2 10*3/uL (ref 0.0–0.7)
Eosinophils Relative: 1.5 % (ref 0.0–5.0)
HCT: 48.4 % — ABNORMAL HIGH (ref 36.0–46.0)
HEMOGLOBIN: 16.3 g/dL — AB (ref 12.0–15.0)
LYMPHS ABS: 2.1 10*3/uL (ref 0.7–4.0)
Lymphocytes Relative: 19.8 % (ref 12.0–46.0)
MCHC: 33.7 g/dL (ref 30.0–36.0)
MCV: 93.7 fl (ref 78.0–100.0)
MONO ABS: 0.4 10*3/uL (ref 0.1–1.0)
Monocytes Relative: 4 % (ref 3.0–12.0)
NEUTROS PCT: 74.1 % (ref 43.0–77.0)
Neutro Abs: 7.9 10*3/uL — ABNORMAL HIGH (ref 1.4–7.7)
Platelets: 212 10*3/uL (ref 150.0–400.0)
RBC: 5.16 Mil/uL — AB (ref 3.87–5.11)
RDW: 12.8 % (ref 11.5–15.5)
WBC: 10.6 10*3/uL — AB (ref 4.0–10.5)

## 2015-01-02 LAB — HEPATIC FUNCTION PANEL
ALBUMIN: 4.7 g/dL (ref 3.5–5.2)
ALK PHOS: 66 U/L (ref 39–117)
ALT: 20 U/L (ref 0–35)
AST: 16 U/L (ref 0–37)
Bilirubin, Direct: 0.2 mg/dL (ref 0.0–0.3)
TOTAL PROTEIN: 7.1 g/dL (ref 6.0–8.3)
Total Bilirubin: 1.1 mg/dL (ref 0.2–1.2)

## 2015-01-02 LAB — BASIC METABOLIC PANEL
BUN: 10 mg/dL (ref 6–23)
CO2: 28 meq/L (ref 19–32)
Calcium: 9.7 mg/dL (ref 8.4–10.5)
Chloride: 106 mEq/L (ref 96–112)
Creatinine, Ser: 0.7 mg/dL (ref 0.40–1.20)
GFR: 95.81 mL/min (ref >60.00)
GLUCOSE: 91 mg/dL (ref 70–99)
POTASSIUM: 3.9 meq/L (ref 3.5–5.1)
SODIUM: 140 meq/L (ref 135–145)

## 2015-01-02 MED ORDER — OMEPRAZOLE 40 MG PO CPDR: 40.0000 mg | DELAYED_RELEASE_CAPSULE | Freq: Every day | ORAL | Status: DC

## 2015-01-02 MED ORDER — VARENICLINE TARTRATE 0.5 MG X 11 & 1 MG X 42 PO MISC: ORAL | Status: DC

## 2015-01-02 NOTE — Progress Notes (Signed)
   Subjective:    Patient ID: Sara Hunter, female    DOB: 1970/05/12, 44 y.o.   MRN: [id number]  HPI 'stomach issues'- sxs started 1 month ago w/ constipation.  Now stools are 'either very soft or like water'.  Has a 'burning sensation' in epigastrium.  Increased bowel sounds.  Intermittent nausea, no vomiting.  No dietary changes, started Wellbutrin 2 months ago to help stop smoking.  Denies increased stress above baseline.  sxs are worse 1st thing in AM.  Tobacco use- Wellbutrin is not effective, had 'weird dreams' on Chantix but this was right after father had passed.  Review of Systems For ROS see HPI     Objective:   Physical Exam  Constitutional: She is oriented to person, place, and time. She appears well-developed and well-nourished. No distress.  HENT:  Head: Normocephalic and atraumatic.  Cardiovascular: Normal rate, regular rhythm and normal heart sounds.   Pulmonary/Chest: Effort normal and breath sounds normal. No respiratory distress. She has no wheezes. She has no rales.  Abdominal: Soft. She exhibits no distension. There is no tenderness. There is no rebound.  Hyperactive BS  Neurological: She is alert and oriented to person, place, and time.  Skin: Skin is warm and dry.  Psychiatric: She has a normal mood and affect. Her behavior is normal. Thought content normal.  Vitals reviewed.         Assessment & Plan:

## 2015-01-02 NOTE — Patient Instructions (Signed)
Follow up in 3-4 weeks to recheck abd pain and smoking cessation We'll notify you of your lab results and make any changes if needed STOP the Wellbutrin Start the Chantix as directed- let me know if you have any trouble START the Omeprazole once daily to decrease the acid production Avoid spicy, acidic foods or alcohol as stomach lining is healing Call with any questions or concerns If you want to join [place] at the new [place] office, any scheduled appointments will automatically transfer and we will see you at [address] [place] [address], [place], [place] [address]  Hang in there!!!

## 2015-01-02 NOTE — Progress Notes (Signed)
Pre visit review using our clinic review tool, if applicable. No additional management support is needed unless otherwise documented below in the visit note. 

## 2015-01-02 NOTE — Assessment & Plan Note (Signed)
New.  Suspect excess acid production/gastritis/GERD.  Start PPI.  Check labs to r/o H pylori, infection, biliary abnormality.  Reviewed dietary and lifestyle modifications.  Will follow.

## 2015-01-02 NOTE — Assessment & Plan Note (Signed)
Ongoing issue for pt.  No improvement on Wellbutrin which may actually be contributing to pt's loose stools.  Stop Wellbutrin.  Pt had 'weird dreams' on Chantix but at was right after her father passed and she was grieving.  Will re-try Chantix and see if pt has better results.  Although she was cautioned that if she again has vivid dreams, we would stop it and try something else.  Pt expressed understanding and is in agreement w/ plan.

## 2015-01-05 LAB — H. PYLORI ANTIBODY, IGG: H PYLORI IGG: NEGATIVE

## 2015-01-22 ENCOUNTER — Ambulatory Visit (INDEPENDENT_AMBULATORY_CARE_PROVIDER_SITE_OTHER): Payer: BLUE CROSS/BLUE SHIELD | Admitting: Podiatry

## 2015-01-22 ENCOUNTER — Encounter: Payer: Self-pay | Admitting: Podiatry

## 2015-01-22 ENCOUNTER — Ambulatory Visit (INDEPENDENT_AMBULATORY_CARE_PROVIDER_SITE_OTHER): Payer: BLUE CROSS/BLUE SHIELD

## 2015-01-22 VITALS — BP 155/86 | HR 79 | Resp 16

## 2015-01-22 DIAGNOSIS — M722 Plantar fascial fibromatosis: Secondary | ICD-10-CM

## 2015-01-22 DIAGNOSIS — M2010 Hallux valgus (acquired), unspecified foot: Secondary | ICD-10-CM | POA: Diagnosis not present

## 2015-01-22 DIAGNOSIS — M79671 Pain in right foot: Secondary | ICD-10-CM

## 2015-01-22 MED ORDER — TRIAMCINOLONE ACETONIDE 10 MG/ML IJ SUSP
10.0000 mg | Freq: Once | INTRAMUSCULAR | Status: AC
  Administered 2015-01-22: 10 mg

## 2015-01-22 MED ORDER — MELOXICAM 15 MG PO TABS: 15.0000 mg | ORAL_TABLET | Freq: Every day | ORAL | Status: DC

## 2015-01-22 NOTE — Progress Notes (Signed)
Subjective:     Patient ID: Sara Hunter, female   DOB: 26-Jul-1970, 44 y.o.   MRN: [id number]  HPI patient states my right heel has really started to hurt me and it's making it painful when I try to ambulate and it started 4 months ago and I don't remember specific injury   Review of Systems     Objective:   Physical Exam Neurovascular status intact muscle strength adequate with inflammation and pain in the right plantar heel that is present and intense when palpated with moderate depression of the arch    Assessment:     Acute plantar fasciitis right with inflammation    Plan:     Advised on physical therapy reviewed x-rays and injected the plantar fascia right 3 mg Kenalog 5 mg Xylocaine and applied fascial brace gave instructions on physical therapy and placed on molded 15 mg daily. Reappoint in 2 weeks

## 2015-01-22 NOTE — Patient Instructions (Signed)

## 2015-01-29 ENCOUNTER — Encounter: Payer: Self-pay | Admitting: Podiatry

## 2015-01-29 ENCOUNTER — Ambulatory Visit (INDEPENDENT_AMBULATORY_CARE_PROVIDER_SITE_OTHER): Payer: BLUE CROSS/BLUE SHIELD | Admitting: Podiatry

## 2015-01-29 VITALS — BP 154/98 | HR 74 | Resp 16

## 2015-01-29 DIAGNOSIS — M722 Plantar fascial fibromatosis: Secondary | ICD-10-CM

## 2015-01-29 DIAGNOSIS — M2010 Hallux valgus (acquired), unspecified foot: Secondary | ICD-10-CM

## 2015-01-29 NOTE — Progress Notes (Signed)
Subjective:     Patient ID: Sara Hunter, female   DOB: [birth year], 44 y.o.   MRN: [id number]  HPI patient presents stating I'm doing quite a bit better with discomfort still present if I do a lot of walking   Review of Systems     Objective:   Physical Exam  Neurovascular status intact muscle strength adequate with discomfort in the right plantar fascial that's improved by about 70% with pain still noted upon deep palpation    Assessment:     Improving plantar fasciitis right    Plan:     Advised on physical therapy supportive shoes and anti-inflammatory. At this point I recommended that we increase activity level and see how she responds

## 2015-01-30 ENCOUNTER — Ambulatory Visit: Payer: BLUE CROSS/BLUE SHIELD | Admitting: Family Medicine

## 2015-02-04 ENCOUNTER — Other Ambulatory Visit: Payer: Self-pay

## 2015-02-04 LAB — HM PAP SMEAR: HM PAP: NORMAL

## 2015-02-05 ENCOUNTER — Encounter: Payer: Self-pay | Admitting: General Practice

## 2015-02-05 LAB — CYTOLOGY - PAP

## 2015-02-06 ENCOUNTER — Other Ambulatory Visit: Payer: Self-pay | Admitting: Obstetrics & Gynecology

## 2015-02-06 DIAGNOSIS — R928 Other abnormal and inconclusive findings on diagnostic imaging of breast: Secondary | ICD-10-CM

## 2015-02-19 ENCOUNTER — Ambulatory Visit
Admission: RE | Admit: 2015-02-19 | Discharge: 2015-02-19 | Disposition: A | Discharge status: Home / Self Care | Payer: BLUE CROSS/BLUE SHIELD | Source: Ambulatory Visit | Attending: Obstetrics & Gynecology | Admitting: Obstetrics & Gynecology

## 2015-02-19 ENCOUNTER — Other Ambulatory Visit: Payer: Self-pay | Admitting: Obstetrics & Gynecology

## 2015-02-19 DIAGNOSIS — R928 Other abnormal and inconclusive findings on diagnostic imaging of breast: Secondary | ICD-10-CM

## 2015-02-24 ENCOUNTER — Other Ambulatory Visit: Payer: BLUE CROSS/BLUE SHIELD

## 2015-02-26 ENCOUNTER — Other Ambulatory Visit: Payer: Self-pay | Admitting: Obstetrics & Gynecology

## 2015-02-26 DIAGNOSIS — R928 Other abnormal and inconclusive findings on diagnostic imaging of breast: Secondary | ICD-10-CM

## 2015-02-27 ENCOUNTER — Other Ambulatory Visit: Payer: Self-pay | Admitting: Obstetrics & Gynecology

## 2015-02-27 ENCOUNTER — Ambulatory Visit
Admission: RE | Admit: 2015-02-27 | Discharge: 2015-02-27 | Disposition: A | Discharge status: Home / Self Care | Payer: BLUE CROSS/BLUE SHIELD | Source: Ambulatory Visit | Attending: Obstetrics & Gynecology | Admitting: Obstetrics & Gynecology

## 2015-02-27 DIAGNOSIS — R928 Other abnormal and inconclusive findings on diagnostic imaging of breast: Secondary | ICD-10-CM

## 2015-04-08 ENCOUNTER — Encounter: Payer: Self-pay | Admitting: Podiatry

## 2015-04-08 ENCOUNTER — Ambulatory Visit (INDEPENDENT_AMBULATORY_CARE_PROVIDER_SITE_OTHER): Payer: BLUE CROSS/BLUE SHIELD | Admitting: Podiatry

## 2015-04-08 DIAGNOSIS — M722 Plantar fascial fibromatosis: Secondary | ICD-10-CM

## 2015-04-08 MED ORDER — TRIAMCINOLONE ACETONIDE 10 MG/ML IJ SUSP
10.0000 mg | Freq: Once | INTRAMUSCULAR | Status: AC
  Administered 2015-04-08: 10 mg

## 2015-04-08 MED ORDER — METHYLPREDNISOLONE 4 MG PO TBPK: ORAL_TABLET | ORAL | Status: DC

## 2015-04-09 NOTE — Progress Notes (Signed)
Subjective:     Patient ID: Sara Hunter, female   DOB: 1970/08/01, 44 y.o.   MRN: [id number]  HPI patient states I'm having intense discomfort underneath my right foot and it has simply not improved and is getting worse the last few months   Review of Systems     Objective:   Physical Exam  neurovascular status intact muscle strength adequate range of motion within normal limits with patient found to have exquisite inflammation of the right plantar heel at the insertion into the calcaneus with negative indications of signs of problem within the bone itself with no pain noted upon palpation    Assessment:      acute inflammatory fasciitis right    Plan:      reviewed condition and at this time advised on complete immobilization with the possibility for surgery or shockwave therapy. Today injected the plantar fascia 3 mg Kenalog 5 mg Xylocaine at the insertion and applied boot in order to immobilize. Gave instructions on physical therapy and reappoint to recheck

## 2015-04-22 ENCOUNTER — Ambulatory Visit (INDEPENDENT_AMBULATORY_CARE_PROVIDER_SITE_OTHER): Payer: BLUE CROSS/BLUE SHIELD | Admitting: Podiatry

## 2015-04-22 ENCOUNTER — Encounter: Payer: Self-pay | Admitting: Podiatry

## 2015-04-22 DIAGNOSIS — M722 Plantar fascial fibromatosis: Secondary | ICD-10-CM

## 2015-04-22 NOTE — Patient Instructions (Signed)
Pre-Operative Instructions  Congratulations, you have decided to take an important step to improving your quality of life.  You can be assured that the doctors of [facility] will be with you every step of the way.  1. Plan to be at the surgery center/hospital at least 1 (one) hour prior to your scheduled time unless otherwise directed by the surgical center/hospital staff.  You must have a responsible adult accompany you, remain during the surgery and drive you home.  Make sure you have directions to the surgical center/hospital and know how to get there on time. 2. For hospital based surgery you will need to obtain a history and physical form from your family physician within 1 month prior to the date of surgery- we will give you a form for you primary physician.  3. We make every effort to accommodate the date you request for surgery.  There are however, times where surgery dates or times have to be moved.  We will contact you as soon as possible if a change in schedule is required.   4. No Aspirin/Ibuprofen for one week before surgery.  If you are on aspirin, any non-steroidal anti-inflammatory medications (Mobic, Aleve, Ibuprofen) you should stop taking it 7 days prior to your surgery.  You make take Tylenol  For pain prior to surgery.  5. Medications- If you are taking daily heart and blood pressure medications, seizure, reflux, allergy, asthma, anxiety, pain or diabetes medications, make sure the surgery center/hospital is aware before the day of surgery so they may notify you which medications to take or avoid the day of surgery. 6. No food or drink after midnight the night before surgery unless directed otherwise by surgical center/hospital staff. 7. No alcoholic beverages 24 hours prior to surgery.  No smoking 24 hours prior to or 24 hours after surgery. 8. Wear loose pants or shorts- loose enough to fit over bandages, boots, and casts. 9. No slip on shoes, sneakers are best. 10. Bring  your boot with you to the surgery center/hospital.  Also bring crutches or a walker if your physician has prescribed it for you.  If you do not have this equipment, it will be provided for you after surgery. 11. If you have not been contracted by the surgery center/hospital by the day before your surgery, call to confirm the date and time of your surgery. 12. Leave-time from work may vary depending on the type of surgery you have.  Appropriate arrangements should be made prior to surgery with your employer. 13. Prescriptions will be provided immediately following surgery by your doctor.  Have these filled as soon as possible after surgery and take the medication as directed. 14. Remove nail polish on the operative foot. 15. Wash the night before surgery.  The night before surgery wash the foot and leg well with the antibacterial soap provided and water paying special attention to beneath the toenails and in between the toes.  Rinse thoroughly with water and dry well with a towel.  Perform this wash unless told not to do so by your physician.  Enclosed: 1 Ice pack (please put in freezer the night before surgery)   1 Hibiclens skin cleaner   Pre-op Instructions  If you have any questions regarding the instructions, do not hesitate to call our office.  [place]: [address] [place], [place] [address] [phone]  [place]: [address] , [place] [address] [phone]  [place]: [address]  [place],  [address] [phone]  Dr. Richard   Tuchman DPM, Dr. Norman Regal DPM Dr. Richard Sikora DPM, Dr. M. Todd Hyatt DPM, Dr. Kathryn Egerton DPM 

## 2015-04-23 NOTE — Progress Notes (Signed)
Subjective:     Patient ID: Sara Hunter, female   DOB: 1971-03-15, 44 y.o.   MRN: [id number]  HPI patient presents stating I'm still having a lot of pain in my right heel despite immobilization injections anti-inflammatories and reduced activities   Review of Systems     Objective:   Physical Exam Neurovascular status found to be intact muscle strength adequate range of motion within normal limits with patient noted to have continued discomfort in the plantar heel region right at the insertional point tendon into the calcaneus with fluid buildup around the band    Assessment:     Plantar fasciitis right which has not responded to conservative treatments and is intensely discomforting at the medial band at the insertion to the calcaneus    Plan:     Advised on fact that this is not getting better and at this point due to the long-term nature failure to respond to numerous conservative treatments I discussed endoscopic release of the fascia. Patient wants surgery and at this time I allowed patient to review consent form going over alternative treatments complications and patient is willing to accept risk signs consent form after extensive review and is scheduled for outpatient surgery. Patient stands total recovery can take 6 months to one year and I did dispense surgical shoe at this point for the postoperative period after boot usage. She is given a date and is encouraged to call with any questions prior to procedure

## 2015-05-14 ENCOUNTER — Telehealth: Payer: Self-pay | Admitting: *Deleted

## 2015-05-14 NOTE — Telephone Encounter (Signed)
"  I'm calling to verify I'm scheduled for surgery on 01/31 and get the time so I can register."  You are scheduled for 05/26/2015.  Surgical center will call you a day or two before surgery date with the arrival time.  All I can tell you is that it will be sometime that morning.  "Okay, thank you."

## 2015-05-26 DIAGNOSIS — M722 Plantar fascial fibromatosis: Secondary | ICD-10-CM | POA: Diagnosis not present

## 2015-05-27 ENCOUNTER — Telehealth: Payer: Self-pay | Admitting: *Deleted

## 2015-05-27 NOTE — Telephone Encounter (Signed)
05/26/2015 - pt left message stating she had surgery 05/26/2015 with Dr. Paulla Dolly and her foot is bleeding. 06/05/2015 - I SPOKE with pt and she stated that all the moving around and getting settled, she had some bleeding, all was okay today and her foot is not hurting at all.  I told pt to call with concerns.

## 2015-06-04 ENCOUNTER — Ambulatory Visit (INDEPENDENT_AMBULATORY_CARE_PROVIDER_SITE_OTHER): Payer: BLUE CROSS/BLUE SHIELD | Admitting: Podiatry

## 2015-06-04 DIAGNOSIS — M722 Plantar fascial fibromatosis: Secondary | ICD-10-CM

## 2015-06-04 DIAGNOSIS — Z9889 Other specified postprocedural states: Secondary | ICD-10-CM

## 2015-06-04 NOTE — Progress Notes (Signed)
Subjective:     Patient ID: Sara Hunter, female   DOB: 07-20-1970, 45 y.o.   MRN: [id number]  HPI patient presents stating my heel is feeling quite a bit better   Review of Systems     Objective:   Physical Exam Neurovascular status intact negative Homans sign noted and wound edges well coapted and discomfort in the plantar heel which has improved    Assessment:     Doing well after having endoscopic release medial fascial band right    Plan:     Advised on physical therapy anti-inflammatories and continued boot usage. Reapplied sterile dressing and reappoint 2 weeks for suture removal or earlier if needed

## 2015-06-05 ENCOUNTER — Telehealth: Payer: Self-pay | Admitting: Family Medicine

## 2015-06-05 NOTE — Telephone Encounter (Signed)
LM to schedule f/u appt (past due) and update flu record

## 2015-06-19 ENCOUNTER — Encounter: Payer: Self-pay | Admitting: Podiatry

## 2015-06-19 ENCOUNTER — Ambulatory Visit (INDEPENDENT_AMBULATORY_CARE_PROVIDER_SITE_OTHER): Payer: BLUE CROSS/BLUE SHIELD | Admitting: Podiatry

## 2015-06-19 VITALS — BP 143/86 | HR 80 | Resp 12

## 2015-06-19 DIAGNOSIS — M722 Plantar fascial fibromatosis: Secondary | ICD-10-CM | POA: Diagnosis not present

## 2015-06-19 DIAGNOSIS — Z9889 Other specified postprocedural states: Secondary | ICD-10-CM

## 2015-06-22 NOTE — Progress Notes (Signed)
Subjective:     Patient ID: Sara Hunter, female   DOB: [birth year], 45 y.o.   MRN: [id number]  HPI patient states my right foot is doing well with minimal discomfort   Review of Systems     Objective:   Physical Exam Neurovascular status intact muscle strength adequate with significant diminishment of discomfort in the right plantar heel with stitches intact wound edges well coapted    Assessment:     Doing well post endoscopic surgery right    Plan:     Stitches were removed and sterile dressing reapplied and instructed on gradual return soft shoe gear in the next 2-4 weeks

## 2015-07-03 NOTE — Progress Notes (Signed)
Patient ID: Sara Hunter, female   DOB: March 16, 1971, 45 y.o.   MRN: [id number] Dr Paulla Dolly performed a Rt Endoscopic Plantar Fascial Release on 05/26/15 at [facility]

## 2016-01-19 ENCOUNTER — Encounter: Payer: Self-pay | Admitting: Family Medicine

## 2016-01-19 ENCOUNTER — Ambulatory Visit (INDEPENDENT_AMBULATORY_CARE_PROVIDER_SITE_OTHER): Payer: PRIVATE HEALTH INSURANCE | Admitting: Family Medicine

## 2016-01-19 DIAGNOSIS — F419 Anxiety disorder, unspecified: Principal | ICD-10-CM

## 2016-01-19 DIAGNOSIS — F32A Depression, unspecified: Secondary | ICD-10-CM

## 2016-01-19 DIAGNOSIS — F418 Other specified anxiety disorders: Secondary | ICD-10-CM

## 2016-01-19 DIAGNOSIS — F329 Major depressive disorder, single episode, unspecified: Secondary | ICD-10-CM | POA: Insufficient documentation

## 2016-01-19 HISTORY — DX: Anxiety disorder, unspecified: F41.9

## 2016-01-19 HISTORY — DX: Depression, unspecified: F32.A

## 2016-01-19 MED ORDER — ALPRAZOLAM 0.5 MG PO TABS: 0.5000 mg | ORAL_TABLET | Freq: Two times a day (BID) | ORAL | 3 refills | Status: DC | PRN

## 2016-01-19 MED ORDER — CITALOPRAM HYDROBROMIDE 20 MG PO TABS: 20.0000 mg | ORAL_TABLET | Freq: Every day | ORAL | 3 refills | Status: DC

## 2016-01-19 NOTE — Progress Notes (Signed)
Pre visit review using our clinic review tool, if applicable. No additional management support is needed unless otherwise documented below in the visit note. 
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
## 2016-01-19 NOTE — Assessment & Plan Note (Signed)
New.  Pt immediately started crying when I entered the exam room.  She spent the majority of the visit explaining her complicated family situation and how this is causing extreme stress for her.  She is also in a difficult place in her marriage.  Start low dose SSRI for daily control and add Alprazolam prn.  Reviewed supportive care and red flags that should prompt return.  Pt expressed understanding and is in agreement w/ plan.

## 2016-01-19 NOTE — Patient Instructions (Signed)
Follow up in 3-4 weeks to recheck mood and BP Start the Celexa once daily- take w/ food- to control anxiety Use the alprazolam only as needed for those panicked/out of control moments.  This can cause drowsiness so no driving after taking this for a few hours Try and find a stress outlet Call with any questions or concerns Hang in there!!  You can do this!!!

## 2016-02-24 ENCOUNTER — Encounter: Payer: Self-pay | Admitting: Family Medicine

## 2016-02-24 ENCOUNTER — Ambulatory Visit (INDEPENDENT_AMBULATORY_CARE_PROVIDER_SITE_OTHER): Payer: PRIVATE HEALTH INSURANCE | Admitting: Family Medicine

## 2016-02-24 VITALS — BP 138/86 | HR 83 | Temp 98.4°F | Resp 16 | Ht 64.0 in | Wt 171.4 lb

## 2016-02-24 DIAGNOSIS — F418 Other specified anxiety disorders: Secondary | ICD-10-CM

## 2016-02-24 DIAGNOSIS — F32A Depression, unspecified: Secondary | ICD-10-CM

## 2016-02-24 DIAGNOSIS — F329 Major depressive disorder, single episode, unspecified: Secondary | ICD-10-CM

## 2016-02-24 DIAGNOSIS — F419 Anxiety disorder, unspecified: Principal | ICD-10-CM

## 2016-02-24 MED ORDER — CITALOPRAM HYDROBROMIDE 40 MG PO TABS: 40.0000 mg | ORAL_TABLET | Freq: Every day | ORAL | 3 refills | Status: DC

## 2016-02-24 NOTE — Patient Instructions (Signed)
Follow up in 1 month to recheck mood Increase the Citalopram to 40mg  daily (2 of what you have at home and 1 of the new prescription) Continue to work on [place], exercise, journaling, etc Call with any questions or concerns Hang in there!!!

## 2016-02-24 NOTE — Progress Notes (Signed)
   Subjective:    Patient ID: Sara Hunter, female    DOB: 07-28-1970, 45 y.o.   MRN: [id number]  HPI Anxiety/depression- pt's family issues continue to be a struggle.  She was having nausea w/ the Celexa during the day so she moved this to night and that has made things better.  Will take alprazolam at night to help w/ sleep and anxiety.  Sleeping well.  Pt feels overall 'more calm than before'.  Currently trying to distance herself from the situation.     Review of Systems For ROS see HPI     Objective:   Physical Exam  Constitutional: She is oriented to person, place, and time. She appears well-developed and well-nourished. No distress.  HENT:  Head: Normocephalic and atraumatic.  Neurological: She is alert and oriented to person, place, and time.  Skin: Skin is warm and dry.  Psychiatric: She has a normal mood and affect. Her behavior is normal. Thought content normal.  Vitals reviewed.         Assessment & Plan:

## 2016-02-24 NOTE — Progress Notes (Signed)
Pre visit review using our clinic review tool, if applicable. No additional management support is needed unless otherwise documented below in the visit note. 

## 2016-02-25 NOTE — Assessment & Plan Note (Signed)
Pt doesn't note any improvement since starting Celexa but she is much calmer today and able to speak clearly w/o breaking down.  She is interested in increasing the dose.  Will increase to 40mg  and monitor for improvement and/or side effects.  Pt expressed understanding and is in agreement w/ plan.

## 2016-03-03 ENCOUNTER — Other Ambulatory Visit: Payer: Self-pay | Admitting: Neurological Surgery

## 2016-03-03 DIAGNOSIS — R112 Nausea with vomiting, unspecified: Secondary | ICD-10-CM

## 2016-03-04 ENCOUNTER — Other Ambulatory Visit: Payer: Self-pay | Admitting: Neurological Surgery

## 2016-03-04 DIAGNOSIS — M542 Cervicalgia: Secondary | ICD-10-CM

## 2016-03-08 ENCOUNTER — Ambulatory Visit
Admission: RE | Admit: 2016-03-08 | Discharge: 2016-03-08 | Disposition: A | Discharge status: Home / Self Care | Payer: 59 | Source: Ambulatory Visit | Attending: Neurological Surgery | Admitting: Neurological Surgery

## 2016-03-08 DIAGNOSIS — M542 Cervicalgia: Secondary | ICD-10-CM

## 2016-03-29 ENCOUNTER — Ambulatory Visit: Payer: PRIVATE HEALTH INSURANCE | Admitting: Family Medicine

## 2016-03-29 DIAGNOSIS — Z0289 Encounter for other administrative examinations: Secondary | ICD-10-CM

## 2016-05-05 ENCOUNTER — Ambulatory Visit (INDEPENDENT_AMBULATORY_CARE_PROVIDER_SITE_OTHER): Payer: Commercial Managed Care - PPO | Admitting: Family Medicine

## 2016-05-05 ENCOUNTER — Encounter: Payer: Self-pay | Admitting: Family Medicine

## 2016-05-05 VITALS — BP 130/90 | HR 86 | Temp 98.0°F | Resp 17 | Ht 64.0 in | Wt 176.4 lb

## 2016-05-05 DIAGNOSIS — R1013 Epigastric pain: Secondary | ICD-10-CM | POA: Diagnosis not present

## 2016-05-05 DIAGNOSIS — F418 Other specified anxiety disorders: Secondary | ICD-10-CM | POA: Diagnosis not present

## 2016-05-05 DIAGNOSIS — F329 Major depressive disorder, single episode, unspecified: Secondary | ICD-10-CM

## 2016-05-05 DIAGNOSIS — F32A Depression, unspecified: Secondary | ICD-10-CM

## 2016-05-05 DIAGNOSIS — F419 Anxiety disorder, unspecified: Secondary | ICD-10-CM

## 2016-05-05 LAB — HEPATIC FUNCTION PANEL
ALT: 19 U/L (ref 0–35)
AST: 14 U/L (ref 0–37)
Albumin: 4.2 g/dL (ref 3.5–5.2)
Alkaline Phosphatase: 64 U/L (ref 39–117)
Bilirubin, Direct: 0.1 mg/dL (ref 0.0–0.3)
Total Bilirubin: 0.8 mg/dL (ref 0.2–1.2)
Total Protein: 6.2 g/dL (ref 6.0–8.3)

## 2016-05-05 LAB — CBC WITH DIFFERENTIAL/PLATELET
BASOS PCT: 0.6 % (ref 0.0–3.0)
Basophils Absolute: 0 10*3/uL (ref 0.0–0.1)
EOS ABS: 0.2 10*3/uL (ref 0.0–0.7)
Eosinophils Relative: 2.2 % (ref 0.0–5.0)
HCT: 45.9 % (ref 36.0–46.0)
Hemoglobin: 15.9 g/dL — ABNORMAL HIGH (ref 12.0–15.0)
Lymphocytes Relative: 22.8 % (ref 12.0–46.0)
Lymphs Abs: 1.7 10*3/uL (ref 0.7–4.0)
MCHC: 34.6 g/dL (ref 30.0–36.0)
MCV: 92.6 fl (ref 78.0–100.0)
MONO ABS: 0.3 10*3/uL (ref 0.1–1.0)
Monocytes Relative: 4.3 % (ref 3.0–12.0)
NEUTROS PCT: 70.1 % (ref 43.0–77.0)
Neutro Abs: 5.2 10*3/uL (ref 1.4–7.7)
PLATELETS: 205 10*3/uL (ref 150.0–400.0)
RBC: 4.96 Mil/uL (ref 3.87–5.11)
RDW: 13.1 % (ref 11.5–15.5)
WBC: 7.4 10*3/uL (ref 4.0–10.5)

## 2016-05-05 LAB — BASIC METABOLIC PANEL
BUN: 13 mg/dL (ref 6–23)
CHLORIDE: 106 meq/L (ref 96–112)
CO2: 30 mEq/L (ref 19–32)
Calcium: 9.5 mg/dL (ref 8.4–10.5)
Creatinine, Ser: 0.78 mg/dL (ref 0.40–1.20)
GFR: 84.07 mL/min (ref >60.00)
GLUCOSE: 92 mg/dL (ref 70–99)
POTASSIUM: 4.1 meq/L (ref 3.5–5.1)
Sodium: 143 mEq/L (ref 135–145)

## 2016-05-05 LAB — LIPASE: LIPASE: 41 U/L (ref 11.0–59.0)

## 2016-05-05 LAB — AMYLASE: AMYLASE: 27 U/L (ref 27–131)

## 2016-05-05 LAB — H. PYLORI ANTIBODY, IGG: H Pylori IgG: NEGATIVE

## 2016-05-05 MED ORDER — SUCRALFATE 1 G PO TABS: 1.0000 g | ORAL_TABLET | Freq: Three times a day (TID) | ORAL | 0 refills | Status: DC

## 2016-05-05 MED ORDER — PANTOPRAZOLE SODIUM 40 MG PO TBEC: 40.0000 mg | DELAYED_RELEASE_TABLET | Freq: Every day | ORAL | 3 refills | Status: DC

## 2016-05-05 NOTE — Assessment & Plan Note (Signed)
Deteriorated.  Pt's sxs are worse w/ eating- consistent w/ gastritis in the setting of her increased stress.  Check labs to r/o H pylori or other biliary/GI issues.  Start PPI daily and carafate short term to allow healing.  Reviewed supportive care and red flags that should prompt return.  Pt expressed understanding and is in agreement w/ plan.

## 2016-05-05 NOTE — Progress Notes (Signed)
   Subjective:    Patient ID: Sara Hunter, female    DOB: Nov 09, 1970, 46 y.o.   MRN: [id number]  HPI Anxiety/depression- ongoing issue for pt.  Celexa was increased to 40mg  at last visit but pt 'quit taking it'.  Nausea returned when dose was increased to 40mg .  Stomach issues- pt reports when 'i eat anything it upsets my stomach'.  Pt reports BMs are consistently like 'diarrhea'.  Increased gas and bloating.  Denies GERD, heartburn or indigestion.  Pt reports the upset stomach is a 'burning nausea'.  sxs are occurring daily.   Review of Systems For ROS see HPI     Objective:   Physical Exam  Constitutional: She is oriented to person, place, and time. She appears well-developed and well-nourished. No distress.  HENT:  Head: Normocephalic and atraumatic.  MMM  Neck: Neck supple.  Cardiovascular: Normal rate, regular rhythm and intact distal pulses.   Pulmonary/Chest: Effort normal and breath sounds normal. No respiratory distress. She has no wheezes. She has no rales.  Abdominal: Soft. She exhibits no distension. There is no tenderness. There is no rebound.  Hyperactive BS  Lymphadenopathy:    She has no cervical adenopathy.  Neurological: She is alert and oriented to person, place, and time.  Skin: Skin is warm and dry.  Psychiatric: She has a normal mood and affect. Her behavior is normal. Thought content normal.  Vitals reviewed.         Assessment & Plan:

## 2016-05-05 NOTE — Assessment & Plan Note (Signed)
Pt stopped the Celexa due to nausea.  She is handling her family stressors better than previously and is not interested in medication at this time.  Will not restart or change meds but pt is aware that we may need to revisit this in the future.  Will follow.

## 2016-05-05 NOTE — Progress Notes (Signed)
Pre visit review using our clinic review tool, if applicable. No additional management support is needed unless otherwise documented below in the visit note. 

## 2016-05-05 NOTE — Patient Instructions (Signed)
Follow up by MyChart in 2-3 weeks and let me know how the stomach is doing We'll notify you of your lab results and make any changes if needed Start the Protonix once daily to decrease the acid production Use the Carafate prior to meals to form a protective barrier around the stomach to allow healing- this is a short term medication Try and avoid caffeine, alcohol, tobacco, and spicy/acidic foods as this will worsen you symptoms Call with any questions or concerns Hang in there!!

## 2016-05-24 ENCOUNTER — Other Ambulatory Visit: Payer: Self-pay | Admitting: Family Medicine

## 2016-05-24 NOTE — Telephone Encounter (Signed)
Last OV 05/05/16 carafate last filled 05/05/16 #90 with 0  Please advise?

## 2016-05-24 NOTE — Telephone Encounter (Signed)
[facility] for 1 time refill but if sxs are persisting, will need GI referral

## 2016-08-03 ENCOUNTER — Encounter: Payer: Self-pay | Admitting: General Practice

## 2016-08-05 ENCOUNTER — Telehealth: Payer: Self-pay | Admitting: Family Medicine

## 2016-08-05 DIAGNOSIS — Z0184 Encounter for antibody response examination: Secondary | ICD-10-CM

## 2016-08-05 NOTE — Telephone Encounter (Signed)
Please advise per our records we have 1 MMR, 3 DTap, 1 PPD, and 1 Hep A/Hep B combo.    Since this is for school please advise on the titers needed and we can have pt come in for labs

## 2016-08-05 NOTE — Telephone Encounter (Signed)
These labs were ordered. Can you please schedule pt for lab appt and we can do her Tdap at the same time.

## 2016-08-05 NOTE — Telephone Encounter (Signed)
Pt drop off paperwork/forms to be completed for [facility] on 4/10. Per Dr. Birdie Riddle, need updated shot record. Called pt and she states she does not have an updated record.

## 2016-08-05 NOTE — Telephone Encounter (Signed)
We need titers: Varicella Hep B MMR Polio  Will need updated Tdap

## 2016-08-08 NOTE — Telephone Encounter (Signed)
Pt has been schedule.

## 2016-08-09 ENCOUNTER — Other Ambulatory Visit (INDEPENDENT_AMBULATORY_CARE_PROVIDER_SITE_OTHER): Payer: Commercial Managed Care - PPO

## 2016-08-09 ENCOUNTER — Other Ambulatory Visit: Payer: Self-pay | Admitting: Family Medicine

## 2016-08-09 DIAGNOSIS — Z23 Encounter for immunization: Secondary | ICD-10-CM

## 2016-08-09 DIAGNOSIS — Z0184 Encounter for antibody response examination: Secondary | ICD-10-CM

## 2016-08-10 LAB — MEASLES/MUMPS/RUBELLA IMMUNITY
MUMPS IGG: 10 [AU]/ml — AB (ref <9.00)
RUBELLA: 6.61 {index} — AB (ref <0.90)
RUBEOLA IGG: 32 [AU]/ml — AB (ref <25.00)

## 2016-08-10 LAB — HEPATITIS B SURFACE ANTIBODY,QUALITATIVE: Hep B S Ab: NEGATIVE

## 2016-08-14 LAB — VARICELLA ZOSTER IGG AND IGM
Varicella IgG: 239.9 Index — ABNORMAL HIGH (ref <135.00)
Varicella Zoster Ab IgM: <=0.9 (ref <=0.90)

## 2016-08-16 LAB — POLIOVIRUS (1,3) ABS, NEUTRALIZ.: Polio 3 Titer: 1:16 {titer}

## 2016-08-22 ENCOUNTER — Ambulatory Visit (INDEPENDENT_AMBULATORY_CARE_PROVIDER_SITE_OTHER): Payer: Commercial Managed Care - PPO | Admitting: *Deleted

## 2016-08-22 DIAGNOSIS — Z23 Encounter for immunization: Secondary | ICD-10-CM

## 2016-08-25 ENCOUNTER — Other Ambulatory Visit: Payer: Self-pay | Admitting: Family Medicine

## 2016-08-25 DIAGNOSIS — R7612 Nonspecific reaction to cell mediated immunity measurement of gamma interferon antigen response without active tuberculosis: Secondary | ICD-10-CM

## 2016-08-25 LAB — QUANTIFERON TB GOLD ASSAY (BLOOD)
INTERFERON GAMMA RELEASE ASSAY: POSITIVE — AB
Mitogen-Nil: 8.73 IU/mL
Quantiferon Nil Value: 0.12 IU/mL
Quantiferon Tb Ag Minus Nil Value: 0.38 IU/mL

## 2016-08-26 ENCOUNTER — Ambulatory Visit (INDEPENDENT_AMBULATORY_CARE_PROVIDER_SITE_OTHER)
Admission: RE | Admit: 2016-08-26 | Discharge: 2016-08-26 | Disposition: A | Discharge status: Home / Self Care | Payer: Commercial Managed Care - PPO | Source: Ambulatory Visit | Attending: Family Medicine | Admitting: Family Medicine

## 2016-08-26 ENCOUNTER — Encounter: Payer: Self-pay | Admitting: Radiology

## 2016-08-26 DIAGNOSIS — R7612 Nonspecific reaction to cell mediated immunity measurement of gamma interferon antigen response without active tuberculosis: Secondary | ICD-10-CM | POA: Diagnosis not present

## 2016-09-06 DIAGNOSIS — J324 Chronic pansinusitis: Secondary | ICD-10-CM | POA: Diagnosis not present

## 2016-10-15 ENCOUNTER — Other Ambulatory Visit: Payer: Self-pay | Admitting: Family Medicine

## 2016-10-17 NOTE — Telephone Encounter (Signed)
Medication filled to pharmacy as requested.   

## 2016-10-27 ENCOUNTER — Ambulatory Visit: Payer: Commercial Managed Care - PPO

## 2016-12-16 ENCOUNTER — Other Ambulatory Visit: Payer: Self-pay | Admitting: Family Medicine

## 2016-12-19 NOTE — Telephone Encounter (Signed)
Please advise, this was first sent in on 10/17/16. I do not see a continuation pack?

## 2016-12-19 NOTE — Telephone Encounter (Signed)
Called to verify she began the starter pack, before I send in the continuation pack.

## 2016-12-19 NOTE — Telephone Encounter (Signed)
Needs continuation pack rather than starting pack.  Thanks!

## 2016-12-20 ENCOUNTER — Encounter: Payer: Self-pay | Admitting: General Practice

## 2016-12-20 NOTE — Telephone Encounter (Signed)
Called and LMOVM and also sent a mychart today to see if pt can clarify.

## 2016-12-21 ENCOUNTER — Other Ambulatory Visit: Payer: Self-pay | Admitting: General Surgery

## 2016-12-21 DIAGNOSIS — L729 Follicular cyst of the skin and subcutaneous tissue, unspecified: Secondary | ICD-10-CM

## 2016-12-22 ENCOUNTER — Ambulatory Visit
Admission: RE | Admit: 2016-12-22 | Discharge: 2016-12-22 | Disposition: A | Discharge status: Home / Self Care | Payer: Commercial Managed Care - PPO | Source: Ambulatory Visit | Attending: General Surgery | Admitting: General Surgery

## 2016-12-22 ENCOUNTER — Other Ambulatory Visit: Payer: Self-pay | Admitting: General Surgery

## 2016-12-22 DIAGNOSIS — L729 Follicular cyst of the skin and subcutaneous tissue, unspecified: Secondary | ICD-10-CM

## 2016-12-27 ENCOUNTER — Other Ambulatory Visit: Payer: Self-pay | Admitting: General Surgery

## 2017-01-03 ENCOUNTER — Other Ambulatory Visit: Payer: Self-pay | Admitting: General Surgery

## 2017-01-03 DIAGNOSIS — L729 Follicular cyst of the skin and subcutaneous tissue, unspecified: Secondary | ICD-10-CM

## 2017-01-11 ENCOUNTER — Other Ambulatory Visit: Payer: Self-pay | Admitting: General Surgery

## 2017-01-11 DIAGNOSIS — L729 Follicular cyst of the skin and subcutaneous tissue, unspecified: Secondary | ICD-10-CM

## 2017-01-16 ENCOUNTER — Other Ambulatory Visit: Payer: Self-pay | Admitting: Family Medicine

## 2017-01-17 ENCOUNTER — Encounter: Payer: Self-pay | Admitting: Family Medicine

## 2017-01-17 ENCOUNTER — Other Ambulatory Visit: Payer: Commercial Managed Care - PPO

## 2017-01-17 ENCOUNTER — Ambulatory Visit (INDEPENDENT_AMBULATORY_CARE_PROVIDER_SITE_OTHER): Payer: Commercial Managed Care - PPO | Admitting: Family Medicine

## 2017-01-17 DIAGNOSIS — J209 Acute bronchitis, unspecified: Secondary | ICD-10-CM | POA: Diagnosis not present

## 2017-01-17 DIAGNOSIS — R232 Flushing: Secondary | ICD-10-CM

## 2017-01-17 HISTORY — DX: Flushing: R23.2

## 2017-01-17 MED ORDER — ALBUTEROL SULFATE (2.5 MG/3ML) 0.083% IN NEBU: 2.5000 mg | INHALATION_SOLUTION | Freq: Once | RESPIRATORY_TRACT | Status: DC

## 2017-01-17 MED ORDER — ALBUTEROL SULFATE HFA 108 (90 BASE) MCG/ACT IN AERS: 2.0000 | INHALATION_SPRAY | Freq: Four times a day (QID) | RESPIRATORY_TRACT | 2 refills | Status: DC | PRN

## 2017-01-17 MED ORDER — AZITHROMYCIN 250 MG PO TABS: ORAL_TABLET | ORAL | 0 refills | Status: DC

## 2017-01-17 MED ORDER — PROMETHAZINE-DM 6.25-15 MG/5ML PO SYRP: 5.0000 mL | ORAL_SOLUTION | Freq: Four times a day (QID) | ORAL | 0 refills | Status: DC | PRN

## 2017-01-17 MED ORDER — CITALOPRAM HYDROBROMIDE 20 MG PO TABS: 20.0000 mg | ORAL_TABLET | Freq: Every day | ORAL | 3 refills | Status: DC

## 2017-01-17 NOTE — Addendum Note (Signed)
Addended by: Davis Gourd on: 01/17/2017 09:04 AM   Modules accepted: Orders

## 2017-01-17 NOTE — Assessment & Plan Note (Signed)
New.  Suspect this is hormonally related as pt is about to be 46.  She stopped her SSRI which could improve her sxs.  Restart at lower 20mg  dose daily after she completes her Zpack.  Also instructed her to f/u w/ GYN.  Pt expressed understanding and is in agreement w/ plan.

## 2017-01-17 NOTE — Assessment & Plan Note (Signed)
Pt's acute bronchitis is more complicated due to her heavy smoking.  Air movement improved s/p neb tx- as did cough.  Start Zpack.  Albuterol prn.  Cough syrup prn.  Reviewed supportive care and red flags that should prompt return.  Pt expressed understanding and is in agreement w/ plan.

## 2017-01-17 NOTE — Progress Notes (Signed)
Pre visit review using our clinic review tool, if applicable. No additional management support is needed unless otherwise documented below in the visit note. 

## 2017-01-17 NOTE — Progress Notes (Signed)
   Subjective:    Patient ID: Sara Hunter, female    DOB: [birth year], 46 y.o.   MRN: [id number]  HPI URI- sxs started a little over a week ago.  + sick contacts.  Cough is not productive.  + SOB, wheezing.  Initially had nasal congestion and sinus pressure but this resolved.  No tooth pain or headache.  No hx of asthma.  Hot flashes/night sweats- no longer taking SSRI.  Having emotional mood swings.  Review of Systems For ROS see HPI     Objective:   Physical Exam  Constitutional: She appears well-developed and well-nourished. No distress.  HENT:  Head: Normocephalic and atraumatic.  TMs normal bilaterally Mild nasal congestion Throat w/out erythema, edema, or exudate  Eyes: Pupils are equal, round, and reactive to light. Conjunctivae and EOM are normal.  Neck: Normal range of motion. Neck supple.  Cardiovascular: Normal rate, regular rhythm, normal heart sounds and intact distal pulses.   No murmur heard. Pulmonary/Chest: Effort normal. No respiratory distress. She has no wheezes.  + hacking cough Decreased air movement throughout- improved s/p neb tx  Lymphadenopathy:    She has no cervical adenopathy.  Vitals reviewed.         Assessment & Plan:

## 2017-01-17 NOTE — Patient Instructions (Signed)
Follow up as needed or as scheduled Start the Zpack as directed Use the Albuterol inhaler- 2 puffs every 4-6 hrs as needed for cough Use the cough syrup as needed- may cause drowsiness Mucinex DM for daytime cough/congestion w/o drowsiness QUIT SMOKING!!! After you finish the Zpack, restart the Citalopram daily to help w/ hot flashes and night sweats (and follow up w/ GYN) Call with any questions or concerns Hang in there!!!

## 2017-06-02 ENCOUNTER — Encounter: Payer: Self-pay | Admitting: Podiatry

## 2017-06-02 ENCOUNTER — Ambulatory Visit: Payer: Commercial Managed Care - PPO | Admitting: Podiatry

## 2017-06-02 ENCOUNTER — Ambulatory Visit (INDEPENDENT_AMBULATORY_CARE_PROVIDER_SITE_OTHER): Payer: Commercial Managed Care - PPO

## 2017-06-02 ENCOUNTER — Other Ambulatory Visit: Payer: Self-pay | Admitting: Podiatry

## 2017-06-02 DIAGNOSIS — M79672 Pain in left foot: Secondary | ICD-10-CM | POA: Diagnosis not present

## 2017-06-02 DIAGNOSIS — M722 Plantar fascial fibromatosis: Secondary | ICD-10-CM | POA: Diagnosis not present

## 2017-06-02 DIAGNOSIS — M21619 Bunion of unspecified foot: Secondary | ICD-10-CM | POA: Diagnosis not present

## 2017-06-02 NOTE — Patient Instructions (Addendum)
Pre-Operative Instructions  Congratulations, you have decided to take an important step towards improving your quality of life.  You can be assured that the doctors and staff at [facility] will be with you every step of the way.  Here are some important things you should know:  1. Plan to be at the surgery center/hospital at least 1 (one) hour prior to your scheduled time, unless otherwise directed by the surgical center/hospital staff.  You must have a responsible adult accompany you, remain during the surgery and drive you home.  Make sure you have directions to the surgical center/hospital to ensure you arrive on time. 2. If you are having surgery at [facility] or [facility] hospitals, you will need a copy of your medical history and physical form from your family physician within one month prior to the date of surgery. We will give you a form for your primary physician to complete.  3. We make every effort to accommodate the date you request for surgery.  However, there are times where surgery dates or times have to be moved.  We will contact you as soon as possible if a change in schedule is required.   4. No aspirin/ibuprofen for one week before surgery.  If you are on aspirin, any non-steroidal anti-inflammatory medications (Mobic, Aleve, Ibuprofen) should not be taken seven (7) days prior to your surgery.  You make take Tylenol for pain prior to surgery.  5. Medications - If you are taking daily heart and blood pressure medications, seizure, reflux, allergy, asthma, anxiety, pain or diabetes medications, make sure you notify the surgery center/hospital before the day of surgery so they can tell you which medications you should take or avoid the day of surgery. 6. No food or drink after midnight the night before surgery unless directed otherwise by surgical center/hospital staff. 7. No alcoholic beverages 24-hours prior to surgery.  No smoking 24-hours prior or 24-hours after  surgery. 8. Wear loose pants or shorts. They should be loose enough to fit over bandages, boots, and casts. 9. Don't wear slip-on shoes. Sneakers are preferred. 10. Bring your boot with you to the surgery center/hospital.  Also bring crutches or a walker if your physician has prescribed it for you.  If you do not have this equipment, it will be provided for you after surgery. 11. If you have not been contacted by the surgery center/hospital by the day before your surgery, call to confirm the date and time of your surgery. 12. Leave-time from work may vary depending on the type of surgery you have.  Appropriate arrangements should be made prior to surgery with your employer. 13. Prescriptions will be provided immediately following surgery by your doctor.  Fill these as soon as possible after surgery and take the medication as directed. Pain medications will not be refilled on weekends and must be approved by the doctor. 14. Remove nail polish on the operative foot and avoid getting pedicures prior to surgery. 15. Wash the night before surgery.  The night before surgery wash the foot and leg well with water and the antibacterial soap provided. Be sure to pay special attention to beneath the toenails and in between the toes.  Wash for at least three (3) minutes. Rinse thoroughly with water and dry well with a towel.  Perform this wash unless told not to do so by your physician.  Enclosed: 1 Ice pack (please put in freezer the night before surgery)   1 Hibiclens skin cleaner     Pre-op instructions  If you have any questions regarding the instructions, please do not hesitate to call our office.  [place]: [address], [place], [place] [address] -- [phone]  [place]: [address] [place], [place] [address] -- [phone]  [place]: [address]  [place], [place] [address] -- [phone]  [place]: [address], [address], [place], [place] [address] -- [phone]  Website:  [email or web address] 

## 2017-06-02 NOTE — Progress Notes (Signed)
Subjective:   Patient ID: Sara Hunter, female   DOB: 47 y.o.   MRN: [id number]   HPI Patient presents with severe pain in the left plantar heel like the right one and states she is been dealing with this around 6 months and is worsened over the last 3 months.  The right one did fantastic with surgery 2 years ago   ROS      Objective:  Physical Exam  Neurovascular status intact with exquisite discomfort plantar aspect heel left at the insertional point of the tendon into the calcaneus is mostly on the medial side.  Mild depression of the arch with good digital perfusion and well oriented x3     Assessment:  Acute plantar fasciitis left with inflammation fluid buildup     Plan:  H&P condition reviewed and discussed treatment options.  Due to the intensity of her discomfort and failure to have any progress on the right when she would rather go ahead and just have this corrected and I have recommended endoscopic release of the medial fascial band.  I explained surgery and risk and allowed her to read a consent form and after great detail and discussion she signed consent form understanding recovery can take 6 months to 1 year and just because one foot did so well there is no guarantee the other one will.  Patient is scheduled for outpatient surgery and is encouraged to call with any questions  X-rays indicate spur formation but no indication of stress fracture arthritis

## 2017-06-13 ENCOUNTER — Telehealth: Payer: Self-pay | Admitting: *Deleted

## 2017-06-13 DIAGNOSIS — M722 Plantar fascial fibromatosis: Secondary | ICD-10-CM | POA: Diagnosis not present

## 2017-06-13 NOTE — Telephone Encounter (Signed)
Pt states she needs to reschedule her surgery from today.

## 2017-06-13 NOTE — Telephone Encounter (Signed)
I told pt I would have Sara Hunter - Surgery Coordinator contact her to schedule.

## 2017-06-14 NOTE — Telephone Encounter (Signed)
I called patient and informed her that Dr. Paulla Dolly can do her surgery tomorrow, 06/15/2017, around lunch time.  She stated that would be fine.

## 2017-06-20 ENCOUNTER — Encounter: Payer: Self-pay | Admitting: General Practice

## 2017-06-21 ENCOUNTER — Encounter: Payer: Self-pay | Admitting: Podiatry

## 2017-06-21 ENCOUNTER — Other Ambulatory Visit: Payer: Commercial Managed Care - PPO

## 2017-06-21 ENCOUNTER — Ambulatory Visit (INDEPENDENT_AMBULATORY_CARE_PROVIDER_SITE_OTHER): Payer: Commercial Managed Care - PPO | Admitting: Podiatry

## 2017-06-21 DIAGNOSIS — M722 Plantar fascial fibromatosis: Secondary | ICD-10-CM | POA: Diagnosis not present

## 2017-06-21 NOTE — Progress Notes (Signed)
Subjective:   Patient ID: Sara Hunter, female   DOB: 47 y.o.   MRN: [id number]   HPI Patient presents stating left foot is doing really well with minimal discomfort and able to walk   ROS      Objective:  Physical Exam  Neurovascular status intact negative Homans sign was noted wound edges well coapted with minimal plantar discomfort     Assessment:  Doing well post endoscopic surgery left     Plan:  Reapplied sterile dressing instructed on continued compression elevation immobilization and reappoint 2 weeks for suture removal or earlier if needed

## 2017-07-06 ENCOUNTER — Ambulatory Visit (INDEPENDENT_AMBULATORY_CARE_PROVIDER_SITE_OTHER): Payer: Commercial Managed Care - PPO | Admitting: Podiatry

## 2017-07-06 ENCOUNTER — Encounter: Payer: Self-pay | Admitting: Podiatry

## 2017-07-06 DIAGNOSIS — M722 Plantar fascial fibromatosis: Secondary | ICD-10-CM | POA: Diagnosis not present

## 2017-07-06 NOTE — Progress Notes (Signed)
Subjective:   Patient ID: Sara Hunter, female   DOB: 47 y.o.   MRN: [id number]   HPI Patient states doing well with the left foot with minimal discomfort and stitches intact   ROS      Objective:  Physical Exam  Neurovascular status intact with patient's left foot healing well with wound edges well coapted stitches in place     Assessment:  Doing well post endoscopic surgery left     Plan:  Stitches are removed and sterile dressings applied to the sides of the foot and I dispensed ankle compression stocking with instructions on gradual increase in activity levels.  Reappoint as needed

## 2017-10-16 DIAGNOSIS — D485 Neoplasm of uncertain behavior of skin: Secondary | ICD-10-CM | POA: Diagnosis not present

## 2017-10-16 DIAGNOSIS — L720 Epidermal cyst: Secondary | ICD-10-CM | POA: Diagnosis not present

## 2017-12-27 ENCOUNTER — Encounter: Payer: Self-pay | Admitting: Podiatry

## 2017-12-27 ENCOUNTER — Ambulatory Visit (INDEPENDENT_AMBULATORY_CARE_PROVIDER_SITE_OTHER): Payer: BLUE CROSS/BLUE SHIELD

## 2017-12-27 ENCOUNTER — Ambulatory Visit (INDEPENDENT_AMBULATORY_CARE_PROVIDER_SITE_OTHER): Payer: BLUE CROSS/BLUE SHIELD | Admitting: Podiatry

## 2017-12-27 DIAGNOSIS — M779 Enthesopathy, unspecified: Secondary | ICD-10-CM

## 2017-12-27 DIAGNOSIS — M722 Plantar fascial fibromatosis: Secondary | ICD-10-CM

## 2017-12-27 DIAGNOSIS — M7752 Other enthesopathy of left foot: Secondary | ICD-10-CM | POA: Diagnosis not present

## 2017-12-27 MED ORDER — MELOXICAM 15 MG PO TABS: 15.0000 mg | ORAL_TABLET | Freq: Every day | ORAL | 2 refills | Status: DC

## 2017-12-27 MED ORDER — TRIAMCINOLONE ACETONIDE 10 MG/ML IJ SUSP
10.0000 mg | Freq: Once | INTRAMUSCULAR | Status: AC
  Administered 2017-12-27: 10 mg

## 2017-12-27 NOTE — Progress Notes (Signed)
Subjective:   Patient ID: Sara Hunter, female   DOB: 47 y.o.   MRN: [id number]   HPI Patient states her heel is doing real well but she has some pain in her ankle and some swelling that occurred   ROS      Objective:  Physical Exam  Patient's heel has done very well and there is no discomfort noted in the plantar fascia when palpated but I did note there is quite a bit of discomfort now in the sinus tarsi     Assessment:  Possibility for inflammatory sinus tarsitis left with patient having done well with endoscopic heel surgery approximately 6 months ago     Plan:  Reviewed compression and dispensed an ankle compression stocking and injected the sinus tarsi left 3 mg Kenalog 5 mg Xylocaine and advised on anti-inflammatories to take  X-rays were negative for signs that there is been a change in the arch height or any indications of arthritic process

## 2017-12-28 ENCOUNTER — Telehealth: Payer: Self-pay | Admitting: *Deleted

## 2017-12-28 NOTE — Telephone Encounter (Signed)
Pt states the pharmacy received a picture of the medication but not an order. Left message informing pt that I would call the prescription to the pharmacy - [place].

## 2017-12-28 NOTE — Telephone Encounter (Signed)
Left message with orders for meloxicam from 12/27/2017.

## 2018-01-10 DIAGNOSIS — J0100 Acute maxillary sinusitis, unspecified: Secondary | ICD-10-CM | POA: Diagnosis not present

## 2018-02-01 ENCOUNTER — Ambulatory Visit: Payer: BLUE CROSS/BLUE SHIELD | Admitting: Podiatry

## 2018-02-09 ENCOUNTER — Other Ambulatory Visit: Payer: Self-pay

## 2018-02-09 ENCOUNTER — Encounter: Payer: Self-pay | Admitting: Family Medicine

## 2018-02-09 ENCOUNTER — Ambulatory Visit (INDEPENDENT_AMBULATORY_CARE_PROVIDER_SITE_OTHER): Payer: BLUE CROSS/BLUE SHIELD | Admitting: Family Medicine

## 2018-02-09 VITALS — BP 133/83 | HR 82 | Temp 98.1°F | Resp 16 | Ht 64.0 in | Wt 170.5 lb

## 2018-02-09 DIAGNOSIS — F419 Anxiety disorder, unspecified: Secondary | ICD-10-CM

## 2018-02-09 DIAGNOSIS — H6983 Other specified disorders of Eustachian tube, bilateral: Secondary | ICD-10-CM | POA: Diagnosis not present

## 2018-02-09 DIAGNOSIS — F32A Depression, unspecified: Secondary | ICD-10-CM

## 2018-02-09 DIAGNOSIS — Z23 Encounter for immunization: Secondary | ICD-10-CM | POA: Diagnosis not present

## 2018-02-09 DIAGNOSIS — F329 Major depressive disorder, single episode, unspecified: Secondary | ICD-10-CM

## 2018-02-09 DIAGNOSIS — Z720 Tobacco use: Secondary | ICD-10-CM | POA: Diagnosis not present

## 2018-02-09 MED ORDER — VARENICLINE TARTRATE 0.5 MG X 11 & 1 MG X 42 PO MISC: ORAL | 0 refills | Status: DC

## 2018-02-09 MED ORDER — CETIRIZINE HCL 10 MG PO TABS: 10.0000 mg | ORAL_TABLET | Freq: Every day | ORAL | 11 refills | Status: DC

## 2018-02-09 MED ORDER — FLUTICASONE PROPIONATE 50 MCG/ACT NA SUSP: 2.0000 | Freq: Every day | NASAL | 6 refills | Status: DC

## 2018-02-09 MED ORDER — ALPRAZOLAM 0.5 MG PO TABS: 0.5000 mg | ORAL_TABLET | Freq: Two times a day (BID) | ORAL | 3 refills | Status: DC | PRN

## 2018-02-09 MED ORDER — VARENICLINE TARTRATE 1 MG PO TABS: 1.0000 mg | ORAL_TABLET | Freq: Two times a day (BID) | ORAL | 2 refills | Status: DC

## 2018-02-09 NOTE — Progress Notes (Signed)
   Subjective:    Patient ID: Sara Hunter, female    DOB: June 25, 1970, 47 y.o.   MRN: [id number]  HPI Bilateral ear pain- L>R, sxs started ~1 week ago.  No fever.  + nasal congestion.  Had a sinus infxn ~1 month ago.  + cough.  No known sick contacts.  + HAs.  No dizziness.  Tobacco abuse- currently smoking 'at least a pack per day'.  Interested in trying Chantix.  Pt feels motivated this time to quit smoking.  Immunizations- pt is due for 2nd and 3rd Hepatitis A/B vaccines.  Anxiety- pt had husband just separated and she is having 'good days and bad days'.     Review of Systems For ROS see HPI     Objective:   Physical Exam  Constitutional: She appears well-developed and well-nourished. No distress.  HENT:  Head: Normocephalic and atraumatic.  Right Ear: Tympanic membrane is retracted.  Left Ear: Tympanic membrane is retracted.  Nose: Mucosal edema and rhinorrhea present. Right sinus exhibits no maxillary sinus tenderness and no frontal sinus tenderness. Left sinus exhibits no maxillary sinus tenderness and no frontal sinus tenderness.  Mouth/Throat: Mucous membranes are normal. Posterior oropharyngeal erythema (w/ PND) present.  Eyes: Pupils are equal, round, and reactive to light. Conjunctivae and EOM are normal.  Neck: Normal range of motion. Neck supple.  Cardiovascular: Normal rate, regular rhythm and normal heart sounds.  Pulmonary/Chest: Effort normal and breath sounds normal. No respiratory distress. She has no wheezes. She has no rales.  Lymphadenopathy:    She has no cervical adenopathy.  Psychiatric:  Tearful, upset about her recent separation  Vitals reviewed.         Assessment & Plan:  Eustachian tube dysfxn- new.  Pt's sxs and PE consistent w/ dx.  Start nasal steroid and daily antihistamine.  Pt expressed understanding and is in agreement w/ plan.

## 2018-02-09 NOTE — Patient Instructions (Addendum)
Schedule your complete physical in 6 months (we will do the last of the Hep A/B shots at that time) START the Flonase daily- 2 sprays each nostril daily START the Zyrtec (Cetirizine) daily Drink plenty of fluids START the Chantix as directed If the anxiety worsens, please let me know! Call with any questions or concerns Hang in there!!!

## 2018-02-09 NOTE — Assessment & Plan Note (Signed)
Pt reports she is now ready to quit smoking.  Would like to try Chantix again.  Discussed the 3 possible quit options for Chantix and prescriptions were sent.  Will follow.

## 2018-02-09 NOTE — Assessment & Plan Note (Signed)
Deteriorated.  Husband just decided to leave after 27 yrs.  Pt is understandably upset.  She doesn't want to restart a daily medication but would like a refill on Xanax to use prn.  Discussed need for counseling and if she is using Xanax more than a few times/week, she should consider a daily medication.  Will follow.

## 2018-02-13 DIAGNOSIS — L720 Epidermal cyst: Secondary | ICD-10-CM | POA: Diagnosis not present

## 2018-02-14 DIAGNOSIS — L720 Epidermal cyst: Secondary | ICD-10-CM | POA: Diagnosis not present

## 2018-02-19 DIAGNOSIS — Z124 Encounter for screening for malignant neoplasm of cervix: Secondary | ICD-10-CM | POA: Diagnosis not present

## 2018-02-19 DIAGNOSIS — Z6829 Body mass index (BMI) 29.0-29.9, adult: Secondary | ICD-10-CM | POA: Diagnosis not present

## 2018-02-19 DIAGNOSIS — N951 Menopausal and female climacteric states: Secondary | ICD-10-CM | POA: Diagnosis not present

## 2018-02-19 DIAGNOSIS — R87610 Atypical squamous cells of undetermined significance on cytologic smear of cervix (ASC-US): Secondary | ICD-10-CM | POA: Diagnosis not present

## 2018-02-19 DIAGNOSIS — Z01419 Encounter for gynecological examination (general) (routine) without abnormal findings: Secondary | ICD-10-CM | POA: Diagnosis not present

## 2018-02-23 DIAGNOSIS — Z6829 Body mass index (BMI) 29.0-29.9, adult: Secondary | ICD-10-CM | POA: Diagnosis not present

## 2018-02-23 DIAGNOSIS — Z1231 Encounter for screening mammogram for malignant neoplasm of breast: Secondary | ICD-10-CM | POA: Diagnosis not present

## 2018-02-28 DIAGNOSIS — Z6830 Body mass index (BMI) 30.0-30.9, adult: Secondary | ICD-10-CM | POA: Diagnosis not present

## 2018-02-28 DIAGNOSIS — N951 Menopausal and female climacteric states: Secondary | ICD-10-CM | POA: Diagnosis not present

## 2018-04-20 ENCOUNTER — Ambulatory Visit (INDEPENDENT_AMBULATORY_CARE_PROVIDER_SITE_OTHER): Payer: BLUE CROSS/BLUE SHIELD | Admitting: Family Medicine

## 2018-04-20 ENCOUNTER — Other Ambulatory Visit (HOSPITAL_COMMUNITY)
Admission: RE | Admit: 2018-04-20 | Discharge: 2018-04-20 | Disposition: A | Discharge status: Home / Self Care | Payer: BLUE CROSS/BLUE SHIELD | Source: Ambulatory Visit | Attending: Family Medicine | Admitting: Family Medicine

## 2018-04-20 ENCOUNTER — Encounter: Payer: Self-pay | Admitting: Family Medicine

## 2018-04-20 VITALS — BP 144/98 | HR 107 | Temp 98.4°F | Resp 16 | Wt 176.0 lb

## 2018-04-20 DIAGNOSIS — R300 Dysuria: Secondary | ICD-10-CM

## 2018-04-20 DIAGNOSIS — Z202 Contact with and (suspected) exposure to infections with a predominantly sexual mode of transmission: Secondary | ICD-10-CM | POA: Diagnosis not present

## 2018-04-20 LAB — POCT URINALYSIS DIPSTICK
BILIRUBIN UA: NEGATIVE
Blood, UA: NEGATIVE
Glucose, UA: NEGATIVE
KETONES UA: NEGATIVE
Leukocytes, UA: NEGATIVE
Nitrite, UA: NEGATIVE
PROTEIN UA: NEGATIVE
Spec Grav, UA: 1.01 (ref 1.010–1.025)
Urobilinogen, UA: 0.2 E.U./dL
pH, UA: 6 (ref 5.0–8.0)

## 2018-04-20 NOTE — Progress Notes (Signed)
   Subjective:    Patient ID: Sara Hunter, female    DOB: 1970/10/10, 47 y.o.   MRN: [id number]  HPI STD exposure- pt and husband recently separated.  Found out this week that husband had girlfriend since September.  Today husband accused her of giving him herpes.  Pt has no lesions- has never had issues.  Had dysuria on 12/15 w/ LBP.  Called GYN on 12/17 and was given abx x5 days.  Burning w/ urination has resolved but she continues to 'have a burning sensation down there'.     Review of Systems For ROS see HPI     Objective:   Physical Exam Vitals signs reviewed.  Constitutional:      Appearance: Normal appearance.  HENT:     Head: Normocephalic and atraumatic.  Neurological:     General: No focal deficit present.     Mental Status: She is alert and oriented to person, place, and time.  Psychiatric:        Mood and Affect: Mood normal.        Behavior: Behavior normal.        Thought Content: Thought content normal.     Comments: Appropriately upset when talking about husband's affair           Assessment & Plan:  Dysuria- this has resolved w/ her recent course of abx.  UA today WNL  Possible exposure to STD- pt's husband has been having affair and today accused pt of giving him herpes.  Check labs to determine if pt has been exposed and or needs tx.  Pt expressed understanding and is in agreement w/ plan.

## 2018-04-20 NOTE — Patient Instructions (Addendum)
Follow up as needed or as scheduled We'll notify you of your lab results and make any changes if needed THIS IS NOT YOUR FAULT! Try and find a way to relax!  You deserve it! Call with any questions or concerns Hang in there!!!

## 2018-04-23 LAB — RPR: RPR Ser Ql: NONREACTIVE

## 2018-04-23 LAB — URINE CYTOLOGY ANCILLARY ONLY
CHLAMYDIA, DNA PROBE: NEGATIVE
NEISSERIA GONORRHEA: NEGATIVE

## 2018-04-23 LAB — HIV ANTIBODY (ROUTINE TESTING W REFLEX): HIV 1&2 Ab, 4th Generation: NONREACTIVE

## 2018-04-24 LAB — HSV(HERPES SMPLX)ABS-I+II(IGG+IGM)-BLD
HSV 1 Glycoprotein G Ab, IgG: 2.38 index — ABNORMAL HIGH (ref 0.00–0.90)
HSV 2 IgG, Type Spec: 6.73 index — ABNORMAL HIGH (ref 0.00–0.90)
HSVI/II Comb IgM: <0.91 Ratio (ref 0.00–0.90)

## 2018-05-02 DIAGNOSIS — J209 Acute bronchitis, unspecified: Secondary | ICD-10-CM | POA: Diagnosis not present

## 2018-05-02 DIAGNOSIS — J0100 Acute maxillary sinusitis, unspecified: Secondary | ICD-10-CM | POA: Diagnosis not present

## 2018-06-07 IMAGING — US US PELVIS LIMITED
1 series · 14 of 15 positions shown · non-contrast
Comparison: none

:
LIMITED ULTRASOUND OVER THE RIGHT SACRAL REGION
HISTORY: Palpable area over the lower back for 11 years. The lump
began as a pea-sized mass after an epidural with childbirth and has
increased in size over the years.
TECHNIQUE: Grayscale and color Doppler images were obtained.

[Series 1: us pelvis limited · 0.05mm/px · 14 of 15 slices shown]
[im 1/15]
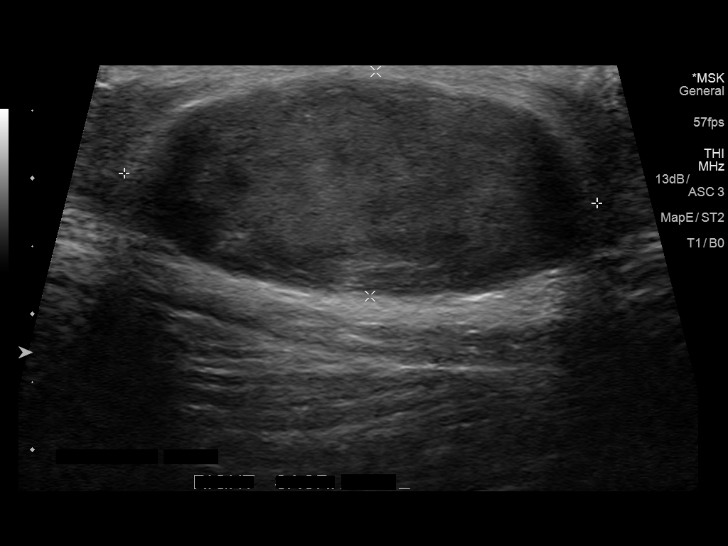
[im 2/15]
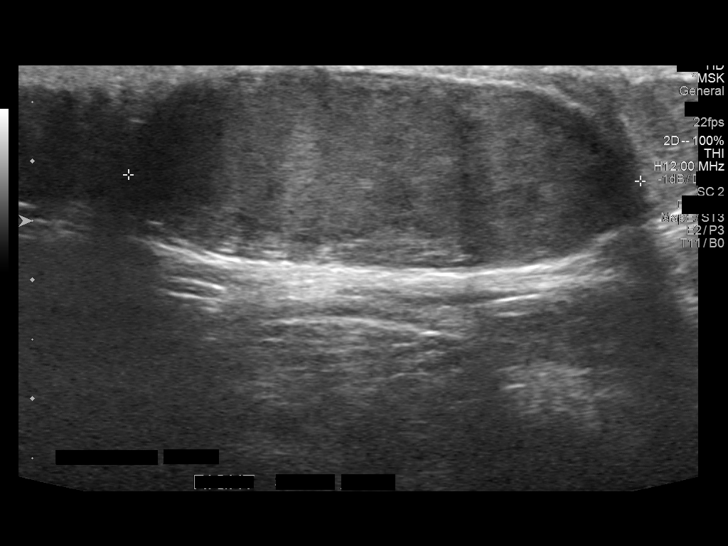
[im 3/15]
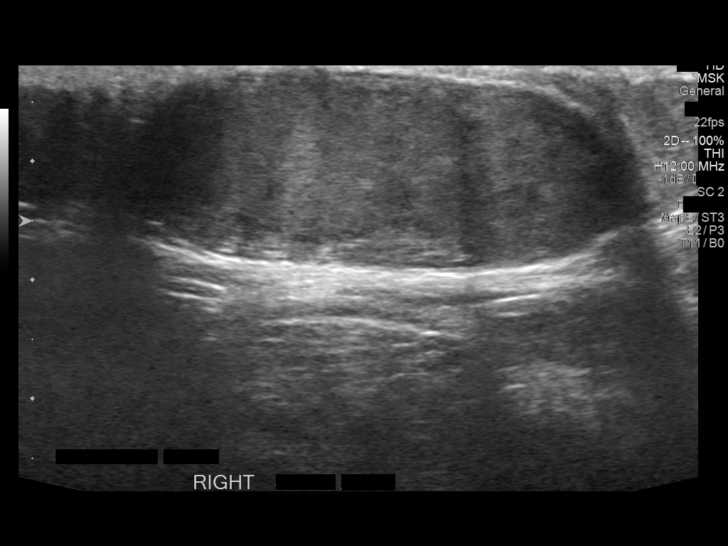
[im 4/15]
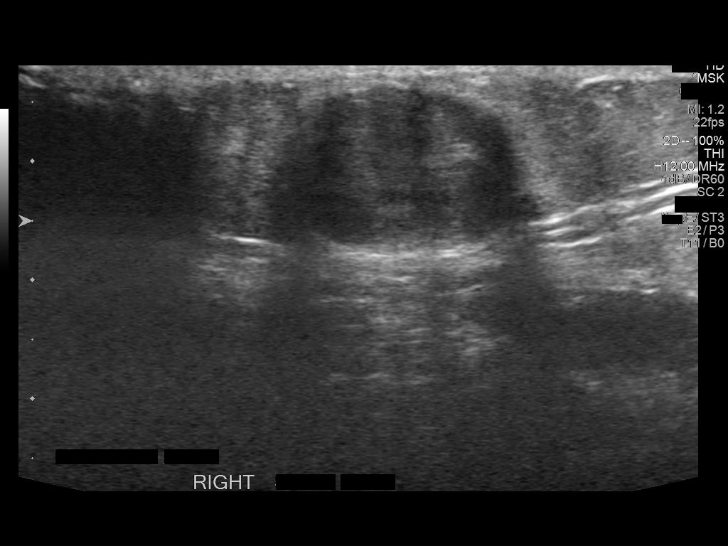
[im 5/15]
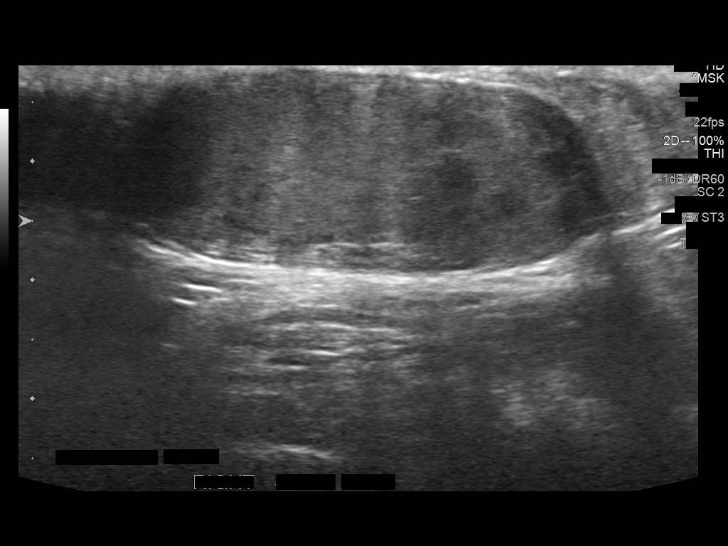
[im 6/15]
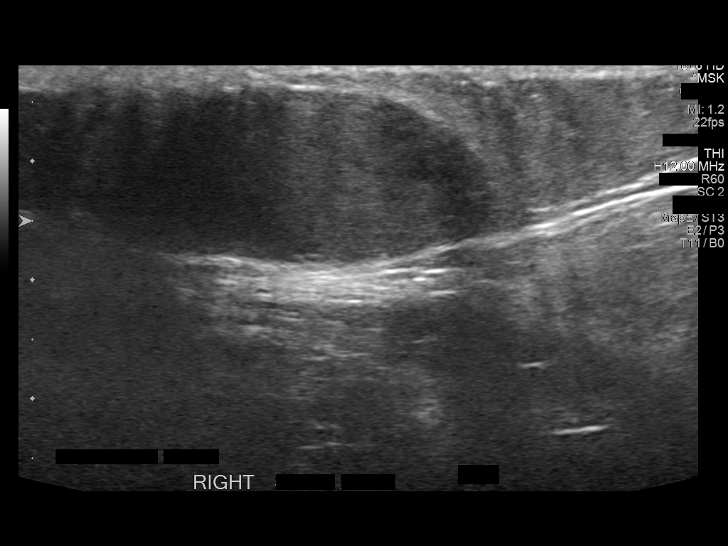
[im 7/15]
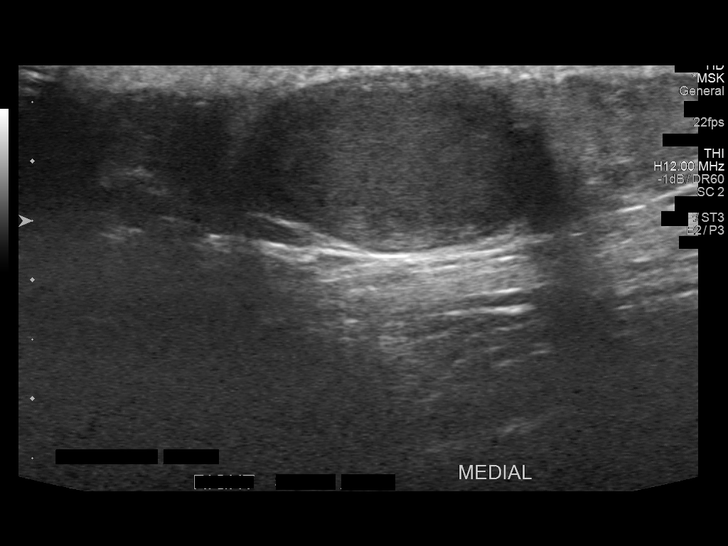
[im 9/15]
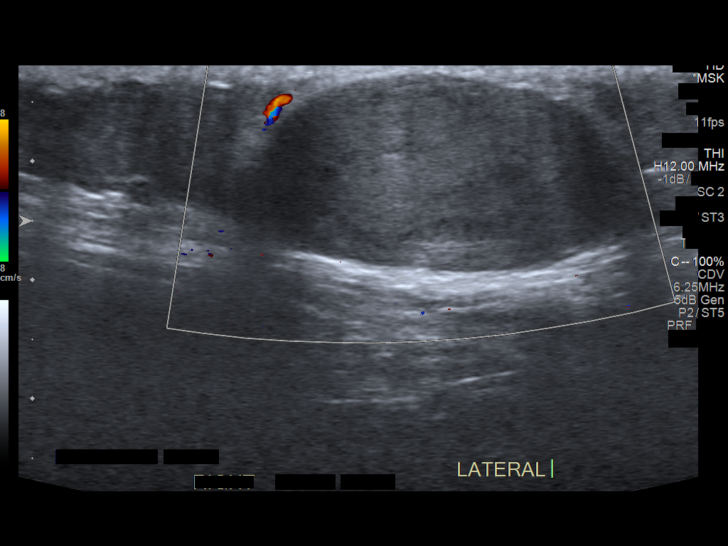
[im 10/15]
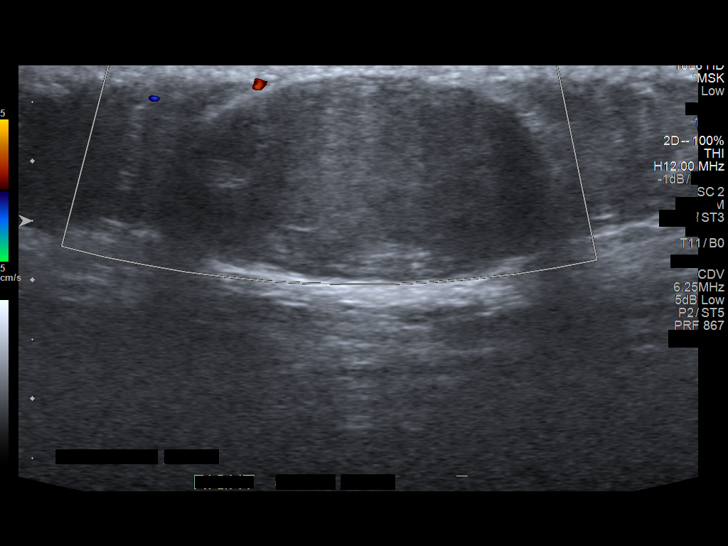
[im 11/15]
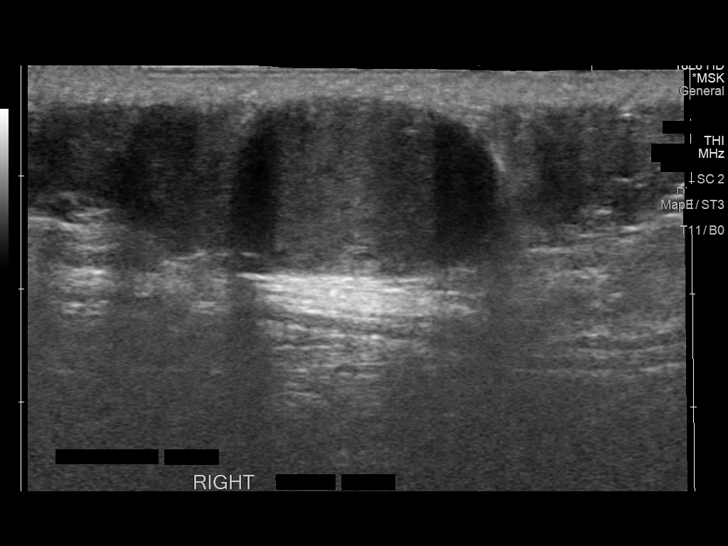
[im 12/15]
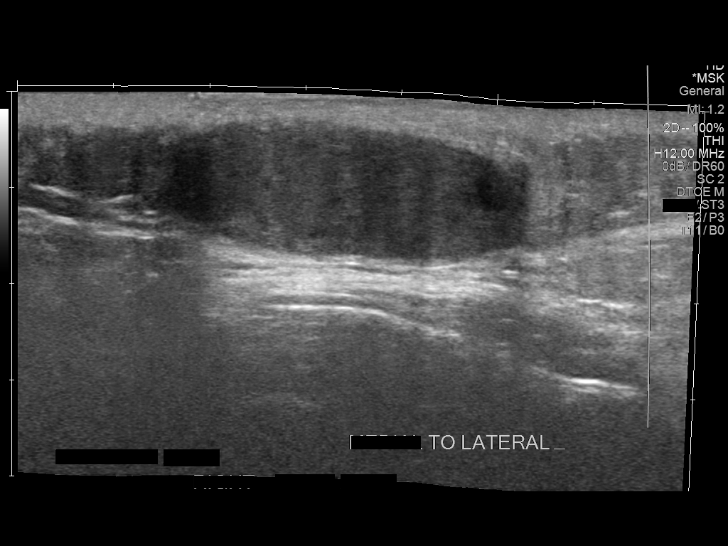
[im 13/15]
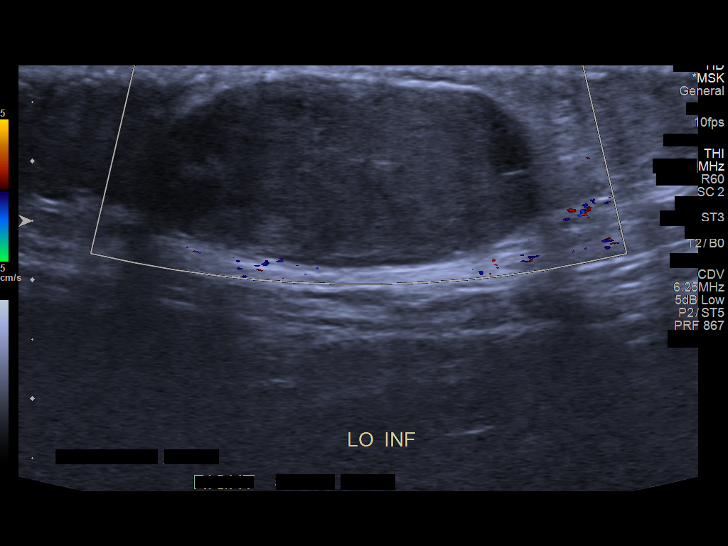
[im 14/15]
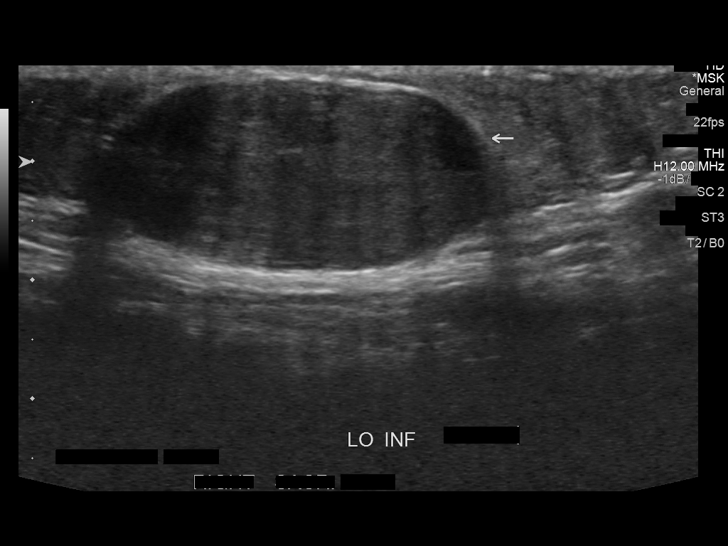
[im 15/15]
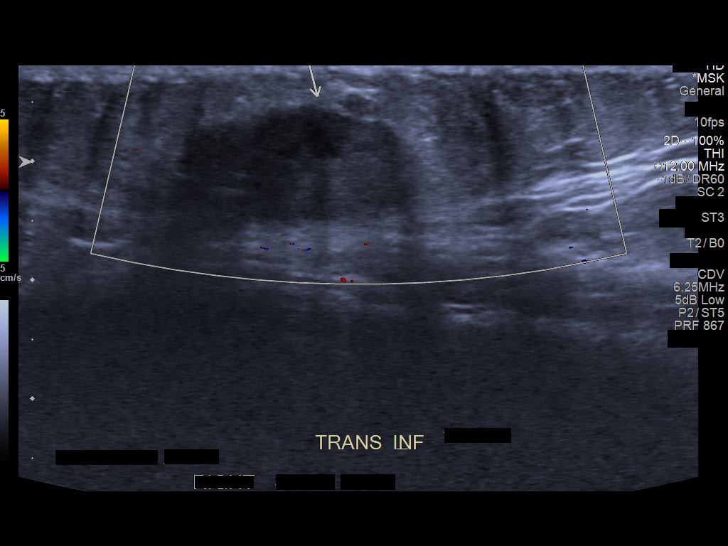

[14 of 15 positions shown; findings below may reference images not displayed]

FINDINGS: An oval mass measuring 3.5 x 1.7 x 4.3 cm is identified in the
region of the patient's palpable lump. The mass is hypoechoic with
no internal blood flow identified. There may be a small amount of
internal fluid inferiorly. There is increased through transmission.
No other significant abnormalities.
IMPRESSION: 1. There is an oval hypoechoic mass in the region of the patient's
symptoms. It is unclear whether this is a solid hypovascular mass or
a complex cystic mass. The etiology is unclear. An MRI may further
characterize this mass. Alternately, the mass could be easily
aspirated and/or biopsied.
These results will be called to the ordering clinician or
representative by the Radiologist Assistant, and communication
documented in the PACS or zVision Dashboard.

## 2018-07-28 IMAGING — CT CT CERVICAL SPINE W/O CM
2 series · 10 of 14 positions shown, 12 images · non-contrast
Comparison: Cervical spine MRI 11/27/2014

CLINICAL DATA: Neck pain when lifting. Left hand pain and tingling
for 6 months.

EXAM:
CT CERVICAL SPINE WITHOUT CONTRAST
TECHNIQUE: Multidetector CT imaging of the cervical spine was performed without
intravenous contrast. Multiplanar CT image reconstructions were also
generated.

[Series 3: cspine soft · axial · 0.30mm/px · z∈[-642,-516]mm · 5 of 95 slices shown]
[im 16/95  soft-tissue]
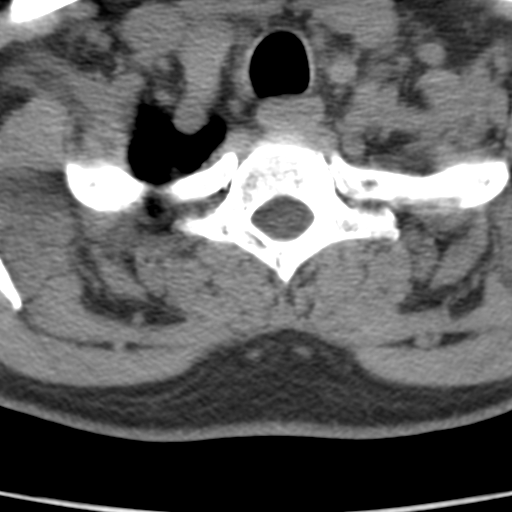
[im 32/95  soft-tissue]
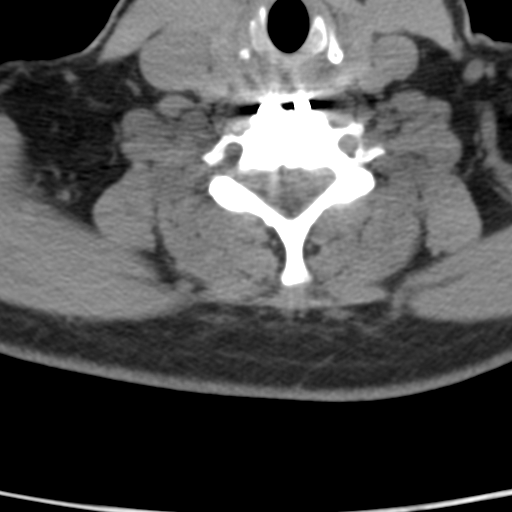
[im 48/95  soft-tissue]
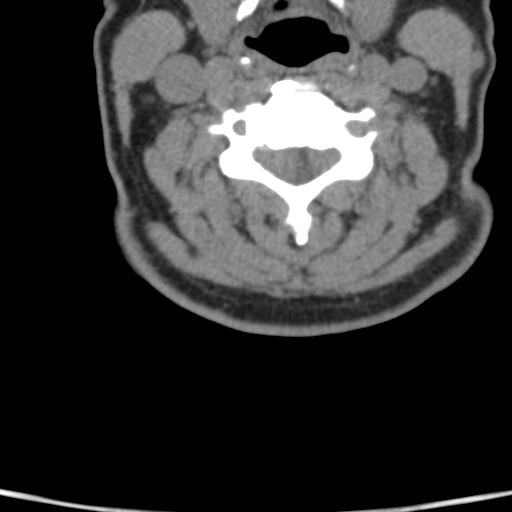
[im 63/95  soft-tissue]
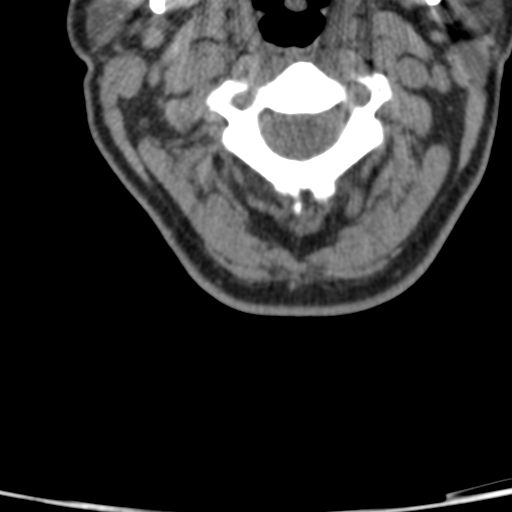
[im 79/95  soft-tissue]
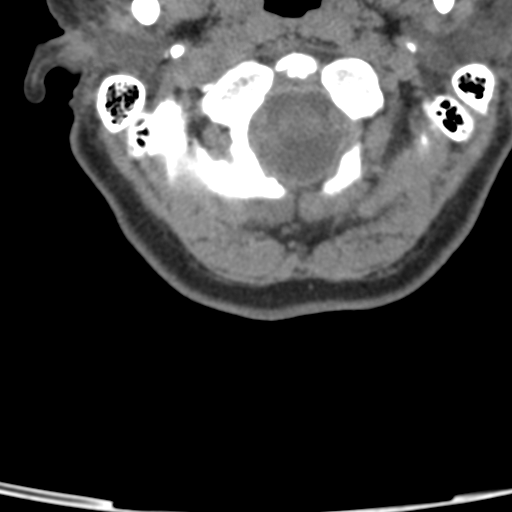

[Series 5: angled axial · axial · 0.29mm/px · z∈[-662,-531]mm · 5 of 103 slices shown, 7 images]
[im 18/103  soft-tissue]
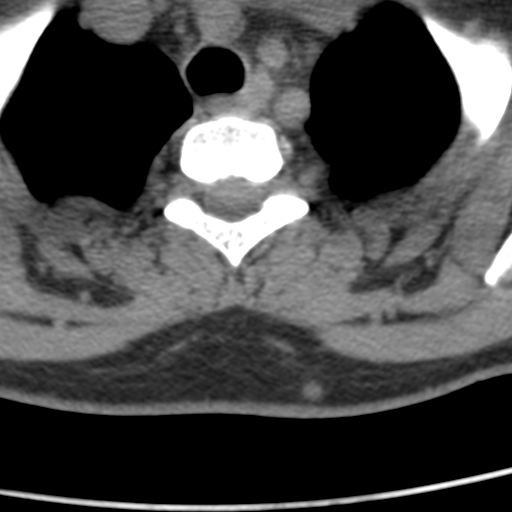
[im 18/103  bone]
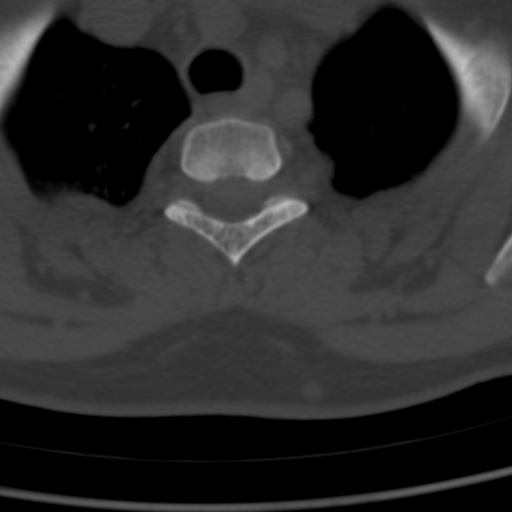
[im 35/103  bone]
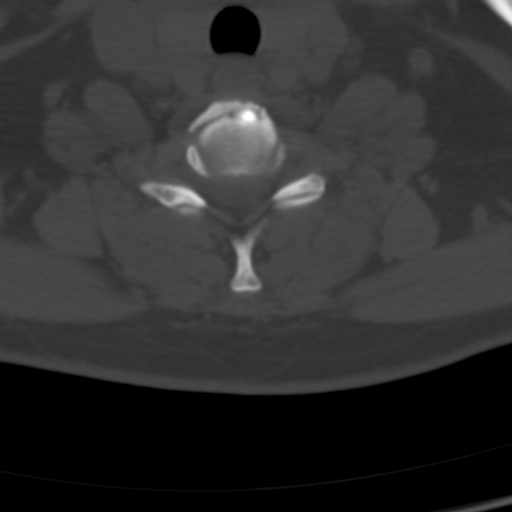
[im 52/103  bone]
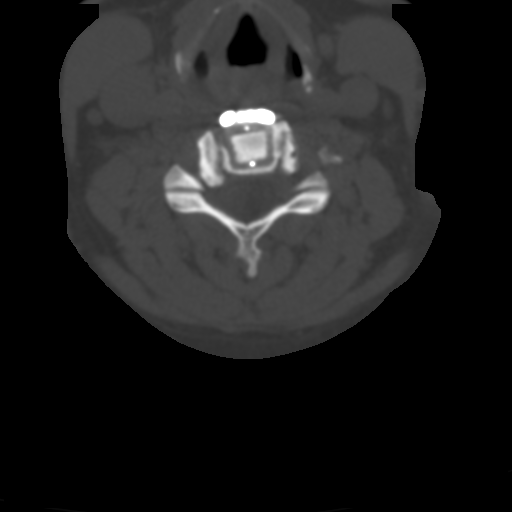
[im 69/103  bone]
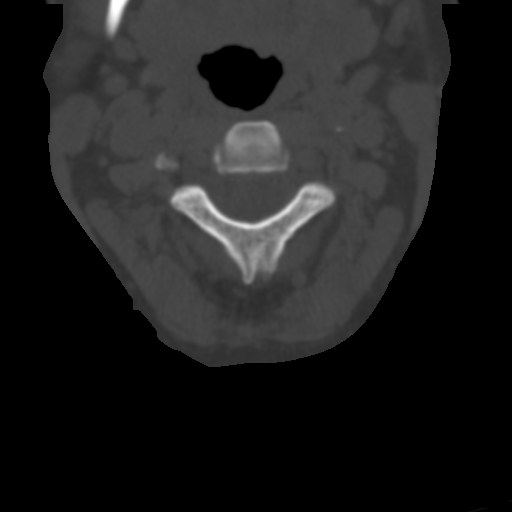
[im 86/103  soft-tissue]
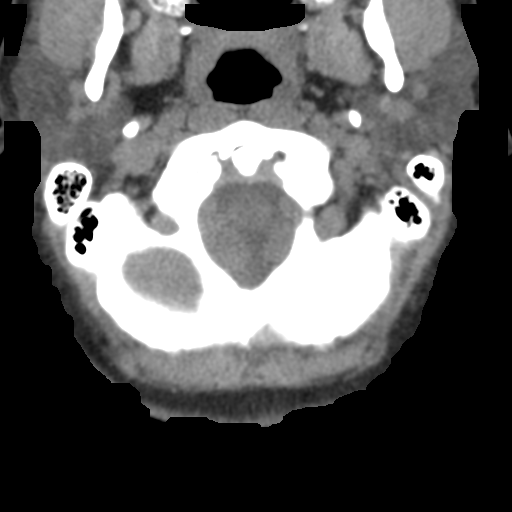
[im 86/103  bone]
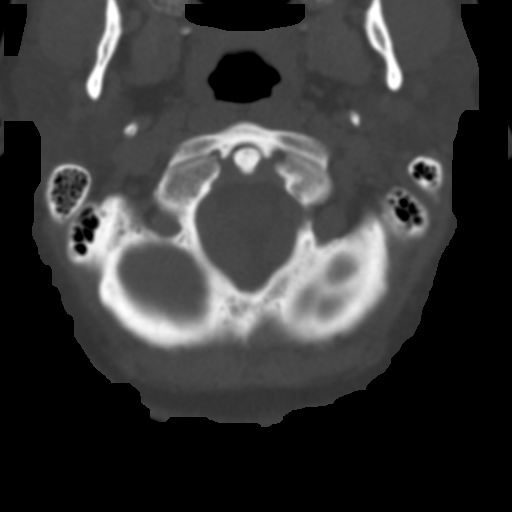

[10 of 14 positions shown; findings below may reference images not displayed]

FINDINGS: Alignment: Straight cervical spine without subluxation.

Skull base and vertebrae: No acute fracture. No primary bone lesion
or focal pathologic process.

Soft tissues and spinal canal: No prevertebral fluid or swelling. No
visible canal hematoma.

Disc levels:

C2-3: Unremarkable.

C3-4: Disc narrowing with mild endplate and uncovertebral ridging.
No evidence of impingement.

C4-5: ACDF with solid bony fusion. Foramina are patent with residual
ridging in stenosis greater on the right. Foraminal stenosis is
improved from comparison. Patent canal.

C5-6: ACDF with no convincing interbody fusion and lucency around
both C6 screws. Mild residual vertebral ridging on the left, but
good foraminal patency.

C6-7: No significant adjacent segment degenerative change. No
evidence of impingement.

C7-T1:Unremarkable.

Upper chest: No evidence of active disease.

Other: Subcutaneous and dermal mass in the left upper back is likely
a dermal inclusion cyst, measuring up to 1 cm.
IMPRESSION: 1. C4-5 ACDF with solid bony fusion. Right more than left foraminal
narrowing is improved from 5344 preoperative MRI.
2. C5-6 ACDF. No demonstrated interbody fusion and lucency around
the C6 screws, possible ongoing motion.
3. Adjacent segments are stable from 5344.

## 2018-08-15 ENCOUNTER — Encounter: Payer: BLUE CROSS/BLUE SHIELD | Admitting: Family Medicine

## 2019-02-22 ENCOUNTER — Other Ambulatory Visit: Payer: Self-pay | Admitting: Family Medicine

## 2019-03-04 ENCOUNTER — Other Ambulatory Visit: Payer: Self-pay | Admitting: Family Medicine

## 2019-03-04 NOTE — Telephone Encounter (Signed)
Please advise 

## 2019-03-07 ENCOUNTER — Telehealth: Payer: Self-pay | Admitting: Family Medicine

## 2019-03-07 NOTE — Telephone Encounter (Signed)
Pt called in stating that she picked up the chantix 1mg  tablets today she states that this is not the starter pack and wanted to know if Dr. Birdie Riddle wanted her to take half of the tablet or the whole 1mg  tablet. Pt can be reached at the home #

## 2019-03-07 NOTE — Telephone Encounter (Signed)
The refill request was for the continuation pack, not the starter pack.  We can send a starter pack if needed and that way she can save what she has for month 2

## 2019-03-07 NOTE — Telephone Encounter (Signed)
Please advise 

## 2019-03-08 MED ORDER — CHANTIX STARTING MONTH PAK 0.5 MG X 11 & 1 MG X 42 PO TABS: ORAL_TABLET | ORAL | 0 refills | Status: DC

## 2019-03-08 NOTE — Telephone Encounter (Signed)
Called and spoke with pt. Starter pack sent to pharmacy.

## 2019-03-28 DIAGNOSIS — Z6831 Body mass index (BMI) 31.0-31.9, adult: Secondary | ICD-10-CM | POA: Diagnosis not present

## 2019-03-28 DIAGNOSIS — Z01419 Encounter for gynecological examination (general) (routine) without abnormal findings: Secondary | ICD-10-CM | POA: Diagnosis not present

## 2019-03-28 DIAGNOSIS — Z124 Encounter for screening for malignant neoplasm of cervix: Secondary | ICD-10-CM | POA: Diagnosis not present

## 2019-03-28 DIAGNOSIS — Z30432 Encounter for removal of intrauterine contraceptive device: Secondary | ICD-10-CM | POA: Diagnosis not present

## 2019-03-28 DIAGNOSIS — Z1231 Encounter for screening mammogram for malignant neoplasm of breast: Secondary | ICD-10-CM | POA: Diagnosis not present

## 2019-03-28 LAB — HM PAP SMEAR

## 2019-03-28 LAB — HM MAMMOGRAPHY: HM Mammogram: NORMAL (ref 0–4)

## 2019-04-12 ENCOUNTER — Encounter: Payer: Self-pay | Admitting: General Practice

## 2019-05-01 DIAGNOSIS — Z20828 Contact with and (suspected) exposure to other viral communicable diseases: Secondary | ICD-10-CM | POA: Diagnosis not present

## 2019-07-24 ENCOUNTER — Other Ambulatory Visit: Payer: Self-pay | Admitting: Family Medicine

## 2019-08-19 DIAGNOSIS — Z1159 Encounter for screening for other viral diseases: Secondary | ICD-10-CM | POA: Diagnosis not present

## 2019-08-22 DIAGNOSIS — D128 Benign neoplasm of rectum: Secondary | ICD-10-CM | POA: Diagnosis not present

## 2019-08-22 DIAGNOSIS — D123 Benign neoplasm of transverse colon: Secondary | ICD-10-CM | POA: Diagnosis not present

## 2019-08-22 DIAGNOSIS — Z1211 Encounter for screening for malignant neoplasm of colon: Secondary | ICD-10-CM | POA: Diagnosis not present

## 2019-08-23 ENCOUNTER — Other Ambulatory Visit: Payer: Self-pay

## 2019-08-26 ENCOUNTER — Encounter: Payer: Self-pay | Admitting: Family Medicine

## 2019-08-26 ENCOUNTER — Ambulatory Visit (INDEPENDENT_AMBULATORY_CARE_PROVIDER_SITE_OTHER): Payer: BC Managed Care – PPO | Admitting: Family Medicine

## 2019-08-26 ENCOUNTER — Other Ambulatory Visit: Payer: Self-pay

## 2019-08-26 ENCOUNTER — Ambulatory Visit: Payer: BC Managed Care – PPO | Admitting: Family Medicine

## 2019-08-26 DIAGNOSIS — E669 Obesity, unspecified: Secondary | ICD-10-CM | POA: Insufficient documentation

## 2019-08-26 DIAGNOSIS — I10 Essential (primary) hypertension: Secondary | ICD-10-CM

## 2019-08-26 HISTORY — DX: Essential (primary) hypertension: I10

## 2019-08-26 HISTORY — DX: Obesity, unspecified: E66.9

## 2019-08-26 MED ORDER — LOSARTAN POTASSIUM 100 MG PO TABS: 100.0000 mg | ORAL_TABLET | Freq: Every day | ORAL | 3 refills | Status: DC

## 2019-08-26 NOTE — Patient Instructions (Signed)
Follow up in 2-3 weeks to recheck blood pressure We'll notify you of your lab results and make any changes if needed START the Losartan once daily for blood pressure Try and decrease your smoking- you don't have to quit cold [place]! Limit your salt intake Increase your water Call with any questions or concerns We'll get this right! Happy Mother's Day!!!

## 2019-08-26 NOTE — Assessment & Plan Note (Signed)
New.  Pt has gained 16 lbs since lats visit.  BMI now 33.  Stressed need for healthy diet and regular exercise.  Check labs to risk stratify.  Will follow.

## 2019-08-26 NOTE — Assessment & Plan Note (Signed)
New.  Pt has had elevated BP readings previously but not this high.  At this point, we need to start medication to improve these readings.  Since she is having some swelling, the eventual plan is to add HCTZ for symptom relief but did not want to start 2 meds at one time in case of a reaction.  Discussed need to quit smoking, limit sodium intake, and increase water.  Will start Losartan and monitor closely.

## 2019-08-26 NOTE — Progress Notes (Signed)
   Subjective:    Patient ID: Sara Hunter, female    DOB: [birth year], 49 y.o.   MRN: [id number]  HPI HTN- last time pt was here was 03/2018 and BP was 144/98.  Recently at GYN BP was elevated and BP at colonoscopy ranged 148-196/84-108.  Denies CP.  Has intermittent SOB.  + HAs.  Occasional visual changes that pt attributes to age.  + edema.  Continues to smoke 1ppd.  + family hx- mom, dad, 2 sisters.   Obesity- pt has gained 16 lbs since last visit.  Pt following any particular diet nor exercising.   Review of Systems For ROS see HPI   This visit occurred during the SARS-CoV-2 public health emergency.  Safety protocols were in place, including screening questions prior to the visit, additional usage of staff PPE, and extensive cleaning of exam room while observing appropriate contact time as indicated for disinfecting solutions.       Objective:   Physical Exam Vitals reviewed.  Constitutional:      General: She is not in acute distress.    Appearance: She is well-developed. She is obese.  HENT:     Head: Normocephalic and atraumatic.  Eyes:     Conjunctiva/sclera: Conjunctivae normal.     Pupils: Pupils are equal, round, and reactive to light.  Neck:     Thyroid: No thyromegaly.  Cardiovascular:     Rate and Rhythm: Normal rate and regular rhythm.     Heart sounds: Normal heart sounds. No murmur.  Pulmonary:     Effort: Pulmonary effort is normal. No respiratory distress.     Breath sounds: Normal breath sounds.  Abdominal:     General: There is no distension.     Palpations: Abdomen is soft.     Tenderness: There is no abdominal tenderness.  Musculoskeletal:     Cervical back: Normal range of motion and neck supple.  Lymphadenopathy:     Cervical: No cervical adenopathy.  Skin:    General: Skin is warm and dry.  Neurological:     Mental Status: She is alert and oriented to person, place, and time.  Psychiatric:        Behavior: Behavior normal.          Assessment & Plan:

## 2019-08-27 LAB — CBC WITH DIFFERENTIAL/PLATELET
Basophils Absolute: 0.1 10*3/uL (ref 0.0–0.1)
Basophils Relative: 1.1 % (ref 0.0–3.0)
Eosinophils Absolute: 0.3 10*3/uL (ref 0.0–0.7)
Eosinophils Relative: 5 % (ref 0.0–5.0)
HCT: 43.7 % (ref 36.0–46.0)
Hemoglobin: 14.9 g/dL (ref 12.0–15.0)
Lymphocytes Relative: 30.5 % (ref 12.0–46.0)
Lymphs Abs: 2.1 10*3/uL (ref 0.7–4.0)
MCHC: 34 g/dL (ref 30.0–36.0)
MCV: 93.4 fl (ref 78.0–100.0)
Monocytes Absolute: 0.4 10*3/uL (ref 0.1–1.0)
Monocytes Relative: 6 % (ref 3.0–12.0)
Neutro Abs: 4 10*3/uL (ref 1.4–7.7)
Neutrophils Relative %: 57.4 % (ref 43.0–77.0)
Platelets: 216 10*3/uL (ref 150.0–400.0)
RBC: 4.68 Mil/uL (ref 3.87–5.11)
RDW: 13.2 % (ref 11.5–15.5)
WBC: 6.9 10*3/uL (ref 4.0–10.5)

## 2019-08-27 LAB — HEPATIC FUNCTION PANEL
ALT: 21 U/L (ref 0–35)
AST: 19 U/L (ref 0–37)
Albumin: 4.1 g/dL (ref 3.5–5.2)
Alkaline Phosphatase: 69 U/L (ref 39–117)
Bilirubin, Direct: 0.1 mg/dL (ref 0.0–0.3)
Total Bilirubin: 0.4 mg/dL (ref 0.2–1.2)
Total Protein: 6.3 g/dL (ref 6.0–8.3)

## 2019-08-27 LAB — LIPID PANEL
Cholesterol: 236 mg/dL — ABNORMAL HIGH (ref 0–200)
HDL: 45.2 mg/dL (ref >39.00)
NonHDL: 191.18
Total CHOL/HDL Ratio: 5
Triglycerides: 294 mg/dL — ABNORMAL HIGH (ref 0.0–149.0)
VLDL: 58.8 mg/dL — ABNORMAL HIGH (ref 0.0–40.0)

## 2019-08-27 LAB — BASIC METABOLIC PANEL
BUN: 17 mg/dL (ref 6–23)
CO2: 29 mEq/L (ref 19–32)
Calcium: 9.3 mg/dL (ref 8.4–10.5)
Chloride: 105 mEq/L (ref 96–112)
Creatinine, Ser: 0.9 mg/dL (ref 0.40–1.20)
GFR: 66.14 mL/min (ref >60.00)
Glucose, Bld: 85 mg/dL (ref 70–99)
Potassium: 3.9 mEq/L (ref 3.5–5.1)
Sodium: 140 mEq/L (ref 135–145)

## 2019-08-27 LAB — LDL CHOLESTEROL, DIRECT: Direct LDL: 155 mg/dL

## 2019-08-27 LAB — TSH: TSH: 1.56 u[IU]/mL (ref 0.35–4.50)

## 2019-08-29 ENCOUNTER — Other Ambulatory Visit: Payer: Self-pay | Admitting: General Practice

## 2019-08-29 MED ORDER — ATORVASTATIN CALCIUM 20 MG PO TABS
20.0000 mg | ORAL_TABLET | Freq: Every day | ORAL | 6 refills | Status: DC
Start: 2019-08-29 — End: 2020-03-27

## 2019-09-11 ENCOUNTER — Ambulatory Visit (INDEPENDENT_AMBULATORY_CARE_PROVIDER_SITE_OTHER): Payer: BC Managed Care – PPO | Admitting: Family Medicine

## 2019-09-11 ENCOUNTER — Encounter: Payer: Self-pay | Admitting: Family Medicine

## 2019-09-11 ENCOUNTER — Other Ambulatory Visit: Payer: Self-pay

## 2019-09-11 VITALS — BP 121/82 | HR 78 | Temp 97.8°F | Resp 16 | Ht 64.0 in | Wt 195.0 lb

## 2019-09-11 DIAGNOSIS — I10 Essential (primary) hypertension: Secondary | ICD-10-CM

## 2019-09-11 LAB — BASIC METABOLIC PANEL
BUN: 16 mg/dL (ref 6–23)
CO2: 25 mEq/L (ref 19–32)
Calcium: 9.2 mg/dL (ref 8.4–10.5)
Chloride: 109 mEq/L (ref 96–112)
Creatinine, Ser: 0.73 mg/dL (ref 0.40–1.20)
GFR: 84.2 mL/min (ref >60.00)
Glucose, Bld: 118 mg/dL — ABNORMAL HIGH (ref 70–99)
Potassium: 4.1 mEq/L (ref 3.5–5.1)
Sodium: 141 mEq/L (ref 135–145)

## 2019-09-11 NOTE — Patient Instructions (Signed)
Schedule your complete physical in 6 months We'll notify you of your lab results and make any changes if needed Continue to work on healthy diet and regular exercise- you look great! No med changes at this time Call with any questions or concerns Have a great summer!!!!

## 2019-09-11 NOTE — Assessment & Plan Note (Signed)
Much improved since starting Losartan 100mg  daily.  Currently asymptomatic and BP is well controlled.  No med changes at this time.  Will check BMP to assess Cr and K+.  Pt expressed understanding and is in agreement w/ plan.

## 2019-09-11 NOTE — Progress Notes (Signed)
   Subjective:    Patient ID: Sara Hunter, female    DOB: 12/25/1970, 49 y.o.   MRN: [id number]  HPI HTN- pt was started on Losartan 100mg  at last visit.  BP has improved from 175/98 --> 121/82.  No HAs since starting medication.  No CP, SOB above baseline, HAs, visual changes, edema.   Review of Systems For ROS see HPI   This visit occurred during the SARS-CoV-2 public health emergency.  Safety protocols were in place, including screening questions prior to the visit, additional usage of staff PPE, and extensive cleaning of exam room while observing appropriate contact time as indicated for disinfecting solutions.       Objective:   Physical Exam Vitals reviewed.  Constitutional:      General: She is not in acute distress.    Appearance: She is well-developed. She is obese.  HENT:     Head: Normocephalic and atraumatic.  Eyes:     Conjunctiva/sclera: Conjunctivae normal.     Pupils: Pupils are equal, round, and reactive to light.  Neck:     Thyroid: No thyromegaly.  Cardiovascular:     Rate and Rhythm: Normal rate and regular rhythm.     Heart sounds: Normal heart sounds. No murmur.  Pulmonary:     Effort: Pulmonary effort is normal. No respiratory distress.     Breath sounds: Normal breath sounds.  Abdominal:     General: There is no distension.     Palpations: Abdomen is soft.     Tenderness: There is no abdominal tenderness.  Musculoskeletal:     Cervical back: Normal range of motion and neck supple.  Lymphadenopathy:     Cervical: No cervical adenopathy.  Skin:    General: Skin is warm and dry.  Neurological:     Mental Status: She is alert and oriented to person, place, and time.  Psychiatric:        Behavior: Behavior normal.           Assessment & Plan:

## 2019-09-12 ENCOUNTER — Encounter: Payer: Self-pay | Admitting: General Practice

## 2019-12-22 ENCOUNTER — Other Ambulatory Visit: Payer: Self-pay | Admitting: Family Medicine

## 2020-01-23 ENCOUNTER — Other Ambulatory Visit: Payer: Self-pay | Admitting: Family Medicine

## 2020-03-09 ENCOUNTER — Encounter: Payer: BC Managed Care – PPO | Admitting: Family Medicine

## 2020-03-09 DIAGNOSIS — Z0289 Encounter for other administrative examinations: Secondary | ICD-10-CM

## 2020-03-26 ENCOUNTER — Other Ambulatory Visit: Payer: Self-pay | Admitting: Family Medicine

## 2020-04-27 DIAGNOSIS — Z124 Encounter for screening for malignant neoplasm of cervix: Secondary | ICD-10-CM | POA: Diagnosis not present

## 2020-04-27 DIAGNOSIS — Z6832 Body mass index (BMI) 32.0-32.9, adult: Secondary | ICD-10-CM | POA: Diagnosis not present

## 2020-04-27 DIAGNOSIS — N760 Acute vaginitis: Secondary | ICD-10-CM | POA: Diagnosis not present

## 2020-04-27 DIAGNOSIS — Z01419 Encounter for gynecological examination (general) (routine) without abnormal findings: Secondary | ICD-10-CM | POA: Diagnosis not present

## 2020-04-27 LAB — RESULTS CONSOLE HPV: CHL HPV: NEGATIVE

## 2020-04-28 LAB — HM PAP SMEAR

## 2020-04-28 LAB — LAB REPORT - SCANNED: HM Pap smear: NEGATIVE

## 2020-04-29 ENCOUNTER — Other Ambulatory Visit: Payer: Self-pay | Admitting: Family Medicine

## 2020-05-26 ENCOUNTER — Ambulatory Visit (INDEPENDENT_AMBULATORY_CARE_PROVIDER_SITE_OTHER): Payer: BC Managed Care – PPO | Admitting: Family Medicine

## 2020-05-26 ENCOUNTER — Other Ambulatory Visit: Payer: Self-pay

## 2020-05-26 ENCOUNTER — Encounter: Payer: Self-pay | Admitting: Family Medicine

## 2020-05-26 VITALS — BP 148/90 | HR 76 | Temp 98.0°F | Resp 18 | Ht 64.0 in | Wt 191.0 lb

## 2020-05-26 DIAGNOSIS — M545 Low back pain, unspecified: Secondary | ICD-10-CM | POA: Diagnosis not present

## 2020-05-26 MED ORDER — CYCLOBENZAPRINE HCL 10 MG PO TABS
10.0000 mg | ORAL_TABLET | Freq: Three times a day (TID) | ORAL | 0 refills | Status: DC | PRN
Start: 2020-05-26 — End: 2021-09-30

## 2020-05-26 MED ORDER — PREDNISONE 10 MG PO TABS
ORAL_TABLET | ORAL | 0 refills | Status: DC
Start: 2020-05-26 — End: 2021-09-30

## 2020-05-26 NOTE — Progress Notes (Signed)
   Subjective:    Patient ID: Sara Hunter, female    DOB: 1970-06-18, 50 y.o.   MRN: [id number]  HPI Back pain- pt states she woke Sunday AM w/ low back pain.  She thought she had slept the wrong way but pain has been worsening.  Has been in bed x2 days.  No recent change in activity but did work more last week than usual- more stooping, bending, etc.  Pain 'goes all the way across my lower back but seems to catch over here on the R'.  No radiation of pain.  No bowel or bladder incontinence.  No blood in urine.  Pain is most severe w/ changing positions.  Taking 800mg  ibuprofen q6 hrs w/o relief.  Pain w/ both flexion and extension of back   Review of Systems For ROS see HPI   This visit occurred during the SARS-CoV-2 public health emergency.  Safety protocols were in place, including screening questions prior to the visit, additional usage of staff PPE, and extensive cleaning of exam room while observing appropriate contact time as indicated for disinfecting solutions.       Objective:   Physical Exam Vitals reviewed.  Constitutional:      Appearance: She is not ill-appearing or toxic-appearing.     Comments: Obviously uncomfortable, standing in exam room  HENT:     Head: Normocephalic and atraumatic.  Cardiovascular:     Pulses: Normal pulses.  Musculoskeletal:        General: Tenderness (TTP over lumbar spine and across lower back) present.     Comments: Pain w/ both flexion and extension of back  Neurological:     General: No focal deficit present.     Mental Status: She is alert and oriented to person, place, and time.     Cranial Nerves: No cranial nerve deficit.     Motor: No weakness.     Gait: Gait abnormal (antalgic gait).     Deep Tendon Reflexes: Reflexes normal.           Assessment & Plan:  Acute LBP- new.  No known injury.  No bowel or bladder incontinence.  No radiation of pain.  No red flags on hx or PE.  Suspect she overdid it last week while working.   Start Prednisone taper, muscle relaxer, heat.  Reviewed supportive care and red flags that should prompt return.  Pt expressed understanding and is in agreement w/ plan.

## 2020-05-26 NOTE — Patient Instructions (Addendum)
Follow up as needed or as scheduled If not already scheduled, please schedule your complete physical with me this summer START the Prednisone as directed- take w/ food AVOID ibuprofen, advil, aleve, motrin while on the Prednisone ADD Tylenol (Acetaminophen) as needed for pain USE the cyclobenzaprine (Flexeril) as needed for spasm- may cause drowsiness HEAT! Call with any questions or concerns Hang in there!

## 2020-07-08 ENCOUNTER — Other Ambulatory Visit: Payer: Self-pay | Admitting: Family Medicine

## 2020-07-08 NOTE — Telephone Encounter (Signed)
Called patient  and left a message to see if there is another pharmacy that she would like me to send this Rx to.

## 2020-07-09 ENCOUNTER — Other Ambulatory Visit: Payer: Self-pay | Admitting: Family Medicine

## 2020-07-09 NOTE — Telephone Encounter (Signed)
Patient returned your call please call her back. 

## 2020-07-10 ENCOUNTER — Other Ambulatory Visit: Payer: Self-pay | Admitting: Family Medicine

## 2020-07-10 MED ORDER — VALSARTAN 80 MG PO TABS: 80.0000 mg | ORAL_TABLET | Freq: Every day | ORAL | 3 refills | Status: DC

## 2020-07-10 NOTE — Progress Notes (Signed)
Losartan is on backorder.  Will switch to Valsartan 80mg  daily

## 2020-07-10 NOTE — Telephone Encounter (Signed)
Please advise medication, per pharmacy this medication is on back order.

## 2020-08-13 DIAGNOSIS — M25562 Pain in left knee: Secondary | ICD-10-CM | POA: Diagnosis not present

## 2020-08-13 DIAGNOSIS — M1712 Unilateral primary osteoarthritis, left knee: Secondary | ICD-10-CM | POA: Diagnosis not present

## 2020-09-07 DIAGNOSIS — M25562 Pain in left knee: Secondary | ICD-10-CM | POA: Diagnosis not present

## 2020-09-11 ENCOUNTER — Encounter: Payer: Self-pay | Admitting: Family Medicine

## 2020-09-11 ENCOUNTER — Ambulatory Visit (INDEPENDENT_AMBULATORY_CARE_PROVIDER_SITE_OTHER): Payer: BC Managed Care – PPO | Admitting: Family Medicine

## 2020-09-11 ENCOUNTER — Other Ambulatory Visit: Payer: Self-pay

## 2020-09-11 VITALS — BP 138/85 | HR 81 | Temp 98.5°F | Resp 19 | Ht 64.5 in | Wt 195.0 lb

## 2020-09-11 DIAGNOSIS — Z0000 Encounter for general adult medical examination without abnormal findings: Secondary | ICD-10-CM | POA: Diagnosis not present

## 2020-09-11 DIAGNOSIS — I10 Essential (primary) hypertension: Secondary | ICD-10-CM

## 2020-09-11 LAB — BASIC METABOLIC PANEL
BUN: 19 mg/dL (ref 6–23)
CO2: 21 mEq/L (ref 19–32)
Calcium: 9.1 mg/dL (ref 8.4–10.5)
Chloride: 108 mEq/L (ref 96–112)
Creatinine, Ser: 0.83 mg/dL (ref 0.40–1.20)
GFR: 81.55 mL/min (ref >60.00)
Glucose, Bld: 81 mg/dL (ref 70–99)
Potassium: 3.9 mEq/L (ref 3.5–5.1)
Sodium: 139 mEq/L (ref 135–145)

## 2020-09-11 LAB — CBC WITH DIFFERENTIAL/PLATELET
Basophils Absolute: 0.1 10*3/uL (ref 0.0–0.1)
Basophils Relative: 0.5 % (ref 0.0–3.0)
Eosinophils Absolute: 0.3 10*3/uL (ref 0.0–0.7)
Eosinophils Relative: 3 % (ref 0.0–5.0)
HCT: 42.4 % (ref 36.0–46.0)
Hemoglobin: 14.4 g/dL (ref 12.0–15.0)
Lymphocytes Relative: 21 % (ref 12.0–46.0)
Lymphs Abs: 2.4 10*3/uL (ref 0.7–4.0)
MCHC: 33.9 g/dL (ref 30.0–36.0)
MCV: 92.8 fl (ref 78.0–100.0)
Monocytes Absolute: 0.4 10*3/uL (ref 0.1–1.0)
Monocytes Relative: 3.9 % (ref 3.0–12.0)
Neutro Abs: 8 10*3/uL — ABNORMAL HIGH (ref 1.4–7.7)
Neutrophils Relative %: 71.6 % (ref 43.0–77.0)
Platelets: 221 10*3/uL (ref 150.0–400.0)
RBC: 4.57 Mil/uL (ref 3.87–5.11)
RDW: 13.6 % (ref 11.5–15.5)
WBC: 11.2 10*3/uL — ABNORMAL HIGH (ref 4.0–10.5)

## 2020-09-11 LAB — TSH: TSH: 2.09 u[IU]/mL (ref 0.35–4.50)

## 2020-09-11 LAB — LIPID PANEL
Cholesterol: 134 mg/dL (ref 0–200)
HDL: 42.1 mg/dL (ref >39.00)
LDL Cholesterol: 63 mg/dL (ref 0–99)
NonHDL: 91.93
Total CHOL/HDL Ratio: 3
Triglycerides: 146 mg/dL (ref 0.0–149.0)
VLDL: 29.2 mg/dL (ref 0.0–40.0)

## 2020-09-11 LAB — HEPATIC FUNCTION PANEL
ALT: 17 U/L (ref 0–35)
AST: 14 U/L (ref 0–37)
Albumin: 4.3 g/dL (ref 3.5–5.2)
Alkaline Phosphatase: 51 U/L (ref 39–117)
Bilirubin, Direct: 0.1 mg/dL (ref 0.0–0.3)
Total Bilirubin: 0.5 mg/dL (ref 0.2–1.2)
Total Protein: 6.3 g/dL (ref 6.0–8.3)

## 2020-09-11 NOTE — Assessment & Plan Note (Signed)
Chronic problem.  Adequate control.  Asymptomatic.  Check labs.  No anticipated med changes.  Will follow. 

## 2020-09-11 NOTE — Assessment & Plan Note (Signed)
Pt's PE WNL w/ exception of obesity.  UTD on pap, mammo, colonoscopy, Tdap.  Check labs.  Anticipatory guidance provided.  

## 2020-09-11 NOTE — Patient Instructions (Addendum)
Follow up in 6 months to recheck BP and cholesterol We'll notify you of your lab results and make any changes if needed Continue to work on healthy diet and regular exercise (if you can!) Call with any questions or concerns Stay Safe!  Stay Healthy! GOOD LUCK WITH SURGERY!!!

## 2020-09-11 NOTE — Progress Notes (Signed)
   Subjective:    Patient ID: Sara Hunter, female    DOB: Sep 07, 1970, 50 y.o.   MRN: [id number]  HPI CPE- UTD on pap, mammo, colonoscopy, Tdap.  Declines COVID vaccines  Reviewed past medical, surgical, family and social histories.   Patient Care Team    Relationship Specialty Notifications Start End  Midge Minium, MD PCP - General Family Medicine  05/11/12   Berle Mull, MD Consulting Physician Orthopedic Surgery  11/22/13     Health Maintenance  Topic Date Due  . COVID-19 Vaccine (1) 09/27/2020 (Originally 02/21/1974)  . Hepatitis C Screening  05/26/2021 (Originally 02/22/1987)  . INFLUENZA VACCINE  11/23/2020  . MAMMOGRAM  03/27/2021  . PAP SMEAR-Modifier  03/27/2022  . TETANUS/TDAP  08/23/2026  . COLONOSCOPY (Pts 45-26yrs Insurance coverage will need to be confirmed)  08/21/2029  . HIV Screening  Completed  . HPV VACCINES  Aged Out      Review of Systems Patient reports no hearing changes, adenopathy,fever, weight change,  persistant/recurrent hoarseness , swallowing issues, chest pain, palpitations, edema, persistant/recurrent cough, hemoptysis, dyspnea (rest/exertional/paroxysmal nocturnal), gastrointestinal bleeding (melena, rectal bleeding), abdominal pain, significant heartburn, bowel changes, GU symptoms (dysuria, hematuria, incontinence), Gyn symptoms (abnormal  bleeding, pain),  syncope, focal weakness, memory loss, numbness & tingling, skin/hair/nail changes, abnormal bruising or bleeding, anxiety, or depression.   + worsening vision- plans to schedule eye exam  This visit occurred during the SARS-CoV-2 public health emergency.  Safety protocols were in place, including screening questions prior to the visit, additional usage of staff PPE, and extensive cleaning of exam room while observing appropriate contact time as indicated for disinfecting solutions.       Objective:   Physical Exam General Appearance:    Alert, cooperative, no distress, appears  stated age  Head:    Normocephalic, without obvious abnormality, atraumatic  Eyes:    PERRL, conjunctiva/corneas clear, EOM's intact, fundi    benign, both eyes  Ears:    Normal TM's and external ear canals, both ears  Nose:   Deferred due to COVID  Throat:   Neck:   Supple, symmetrical, trachea midline, no adenopathy;    Thyroid: no enlargement/tenderness/nodules  Back:     Symmetric, no curvature, ROM normal, no CVA tenderness  Lungs:     Clear to auscultation bilaterally, respirations unlabored  Chest Wall:    No tenderness or deformity   Heart:    Regular rate and rhythm, S1 and S2 normal, no murmur, rub   or gallop  Breast Exam:    Deferred to GYN  Abdomen:     Soft, non-tender, bowel sounds active all four quadrants,    no masses, no organomegaly  Genitalia:    Deferred to GYN  Rectal:    Extremities:   Extremities normal, atraumatic, no cyanosis or edema  Pulses:   2+ and symmetric all extremities  Skin:   Skin color, texture, turgor normal, no rashes or lesions  Lymph nodes:   Cervical, supraclavicular, and axillary nodes normal  Neurologic:   CNII-XII intact, normal strength, sensation and reflexes    throughout          Assessment & Plan:

## 2020-09-15 ENCOUNTER — Telehealth: Payer: Self-pay | Admitting: Family Medicine

## 2020-09-15 DIAGNOSIS — S83242A Other tear of medial meniscus, current injury, left knee, initial encounter: Secondary | ICD-10-CM | POA: Diagnosis not present

## 2020-09-15 NOTE — Telephone Encounter (Signed)
Pt called in asking if she should take her BP medication in the morning before her surgery? I did advise pt that Birdie Riddle was already gone for the day but I would ask.  Pt is scheduled to have surgery tomorrow morning at 8am

## 2020-09-16 DIAGNOSIS — G8918 Other acute postprocedural pain: Secondary | ICD-10-CM | POA: Diagnosis not present

## 2020-09-16 DIAGNOSIS — Y999 Unspecified external cause status: Secondary | ICD-10-CM | POA: Diagnosis not present

## 2020-09-16 DIAGNOSIS — M84362A Stress fracture, left tibia, initial encounter for fracture: Secondary | ICD-10-CM | POA: Diagnosis not present

## 2020-09-16 DIAGNOSIS — X58XXXA Exposure to other specified factors, initial encounter: Secondary | ICD-10-CM | POA: Diagnosis not present

## 2020-09-16 DIAGNOSIS — S83232A Complex tear of medial meniscus, current injury, left knee, initial encounter: Secondary | ICD-10-CM | POA: Diagnosis not present

## 2020-09-16 DIAGNOSIS — M6752 Plica syndrome, left knee: Secondary | ICD-10-CM | POA: Diagnosis not present

## 2020-09-16 DIAGNOSIS — S83242A Other tear of medial meniscus, current injury, left knee, initial encounter: Secondary | ICD-10-CM | POA: Diagnosis not present

## 2020-09-16 NOTE — Telephone Encounter (Signed)
Called patient to inform. She states one of the other doctors already informed her she could take her bp medication.

## 2020-09-16 NOTE — Telephone Encounter (Signed)
Pt can hold meds morning of surgery

## 2020-10-21 ENCOUNTER — Encounter: Payer: Self-pay | Admitting: *Deleted

## 2020-11-14 ENCOUNTER — Other Ambulatory Visit: Payer: Self-pay | Admitting: Family Medicine

## 2020-12-29 DIAGNOSIS — M1712 Unilateral primary osteoarthritis, left knee: Secondary | ICD-10-CM | POA: Diagnosis not present

## 2021-01-08 DIAGNOSIS — L720 Epidermal cyst: Secondary | ICD-10-CM | POA: Diagnosis not present

## 2021-01-08 DIAGNOSIS — L738 Other specified follicular disorders: Secondary | ICD-10-CM | POA: Diagnosis not present

## 2021-01-19 DIAGNOSIS — L720 Epidermal cyst: Secondary | ICD-10-CM | POA: Diagnosis not present

## 2021-03-14 ENCOUNTER — Encounter: Payer: Self-pay | Admitting: *Deleted

## 2021-03-15 ENCOUNTER — Ambulatory Visit: Payer: BC Managed Care – PPO | Admitting: Family Medicine

## 2021-03-16 ENCOUNTER — Other Ambulatory Visit: Payer: Self-pay | Admitting: Family Medicine

## 2021-03-24 ENCOUNTER — Ambulatory Visit: Payer: BC Managed Care – PPO | Admitting: Family Medicine

## 2021-03-29 ENCOUNTER — Ambulatory Visit: Payer: BC Managed Care – PPO | Admitting: Family Medicine

## 2021-04-07 DIAGNOSIS — Z1231 Encounter for screening mammogram for malignant neoplasm of breast: Secondary | ICD-10-CM | POA: Diagnosis not present

## 2021-06-21 ENCOUNTER — Other Ambulatory Visit: Payer: Self-pay | Admitting: Family Medicine

## 2021-07-10 ENCOUNTER — Other Ambulatory Visit: Payer: Self-pay | Admitting: Family Medicine

## 2021-07-22 ENCOUNTER — Other Ambulatory Visit: Payer: Self-pay | Admitting: Family Medicine

## 2021-09-30 ENCOUNTER — Encounter: Payer: Self-pay | Admitting: Family Medicine

## 2021-09-30 ENCOUNTER — Telehealth: Payer: Self-pay

## 2021-09-30 ENCOUNTER — Ambulatory Visit (INDEPENDENT_AMBULATORY_CARE_PROVIDER_SITE_OTHER): Payer: Commercial Managed Care - PPO | Admitting: Family Medicine

## 2021-09-30 VITALS — BP 138/84 | HR 97 | Temp 97.6°F | Resp 18 | Ht 64.5 in | Wt 194.6 lb

## 2021-09-30 DIAGNOSIS — I10 Essential (primary) hypertension: Secondary | ICD-10-CM

## 2021-09-30 DIAGNOSIS — E669 Obesity, unspecified: Secondary | ICD-10-CM

## 2021-09-30 DIAGNOSIS — Z0000 Encounter for general adult medical examination without abnormal findings: Secondary | ICD-10-CM | POA: Diagnosis not present

## 2021-09-30 DIAGNOSIS — F32A Depression, unspecified: Secondary | ICD-10-CM

## 2021-09-30 DIAGNOSIS — F419 Anxiety disorder, unspecified: Secondary | ICD-10-CM | POA: Diagnosis not present

## 2021-09-30 LAB — CBC WITH DIFFERENTIAL/PLATELET
Basophils Absolute: 0.1 10*3/uL (ref 0.0–0.1)
Basophils Relative: 1.2 % (ref 0.0–3.0)
Eosinophils Absolute: 0.4 10*3/uL (ref 0.0–0.7)
Eosinophils Relative: 6.7 % — ABNORMAL HIGH (ref 0.0–5.0)
HCT: 44.3 % (ref 36.0–46.0)
Hemoglobin: 15.2 g/dL — ABNORMAL HIGH (ref 12.0–15.0)
Lymphocytes Relative: 31.1 % (ref 12.0–46.0)
Lymphs Abs: 1.8 10*3/uL (ref 0.7–4.0)
MCHC: 34.2 g/dL (ref 30.0–36.0)
MCV: 92.7 fl (ref 78.0–100.0)
Monocytes Absolute: 0.4 10*3/uL (ref 0.1–1.0)
Monocytes Relative: 6.6 % (ref 3.0–12.0)
Neutro Abs: 3.1 10*3/uL (ref 1.4–7.7)
Neutrophils Relative %: 54.4 % (ref 43.0–77.0)
Platelets: 177 10*3/uL (ref 150.0–400.0)
RBC: 4.78 Mil/uL (ref 3.87–5.11)
RDW: 12.9 % (ref 11.5–15.5)
WBC: 5.7 10*3/uL (ref 4.0–10.5)

## 2021-09-30 LAB — BASIC METABOLIC PANEL
BUN: 13 mg/dL (ref 6–23)
CO2: 26 mEq/L (ref 19–32)
Calcium: 9.5 mg/dL (ref 8.4–10.5)
Chloride: 107 mEq/L (ref 96–112)
Creatinine, Ser: 0.72 mg/dL (ref 0.40–1.20)
GFR: 96.01 mL/min (ref >60.00)
Glucose, Bld: 103 mg/dL — ABNORMAL HIGH (ref 70–99)
Potassium: 3.9 mEq/L (ref 3.5–5.1)
Sodium: 142 mEq/L (ref 135–145)

## 2021-09-30 LAB — LIPID PANEL
Cholesterol: 136 mg/dL (ref 0–200)
HDL: 45.5 mg/dL (ref >39.00)
LDL Cholesterol: 65 mg/dL (ref 0–99)
NonHDL: 90.05
Total CHOL/HDL Ratio: 3
Triglycerides: 126 mg/dL (ref 0.0–149.0)
VLDL: 25.2 mg/dL (ref 0.0–40.0)

## 2021-09-30 LAB — HEPATIC FUNCTION PANEL
ALT: 46 U/L — ABNORMAL HIGH (ref 0–35)
AST: 27 U/L (ref 0–37)
Albumin: 4.4 g/dL (ref 3.5–5.2)
Alkaline Phosphatase: 68 U/L (ref 39–117)
Bilirubin, Direct: 0.1 mg/dL (ref 0.0–0.3)
Total Bilirubin: 0.9 mg/dL (ref 0.2–1.2)
Total Protein: 6.6 g/dL (ref 6.0–8.3)

## 2021-09-30 LAB — TSH: TSH: 3.18 u[IU]/mL (ref 0.35–5.50)

## 2021-09-30 LAB — VITAMIN D 25 HYDROXY (VIT D DEFICIENCY, FRACTURES): VITD: 45.3 ng/mL (ref 30.00–100.00)

## 2021-09-30 MED ORDER — FYAVOLV 1-5 MG-MCG PO TABS: 1.0000 | ORAL_TABLET | Freq: Every day | ORAL | 3 refills | Status: DC

## 2021-09-30 MED ORDER — FLUOXETINE HCL 10 MG PO TABS: 10.0000 mg | ORAL_TABLET | Freq: Every day | ORAL | 3 refills | Status: DC

## 2021-09-30 NOTE — Patient Instructions (Addendum)
Follow up in 4-6 weeks to recheck mood We'll notify you of your lab results and make any changes if needed Continue to work on healthy diet and regular exercise- you can do it!! Start the Fluoxetine once daily for anxiety/depression Restart the birth control pills Call with any questions or concerns Hang in there!!!

## 2021-09-30 NOTE — Telephone Encounter (Signed)
-----   Message from Midge Minium, MD sent at 09/30/2021 11:54 AM EDT ----- Labs look great!  ALT (liver enzyme) is mildly elevated but we will repeat this at your upcoming appt.  No other changes at this time

## 2021-09-30 NOTE — Assessment & Plan Note (Signed)
BP was initially elevated upon arrival but did come down somewhat by the end of the visit.  Check labs due to ARB but no anticipated med changes at this time

## 2021-09-30 NOTE — Assessment & Plan Note (Signed)
Deteriorated.  Pt is struggling through her divorce, having 2 teens at home, re-entering the dating world, a new job.  Feels that stopping her HRT is also contributing b/c the patches GYN prescribed 'didn't work'.  Wants to go back on pills.  Prescription sent.  Will also start low dose SSRI to improve mood.  Pt expressed understanding and is in agreement w/ plan.

## 2021-09-30 NOTE — Progress Notes (Signed)
   Subjective:    Patient ID: Sara Hunter, female    DOB: 1970-09-21, 51 y.o.   MRN: [id number]  HPI CPE- UTD on pap, mammo (04/07/21) colonoscopy, Tdap.  Patient Care Team    Relationship Specialty Notifications Start End  Midge Minium, MD PCP - General Family Medicine  05/11/12   Berle Mull, MD Consulting Physician Orthopedic Surgery  11/22/13     Health Maintenance  Topic Date Due   COVID-19 Vaccine (1) Never done   Hepatitis C Screening  Never done   Zoster Vaccines- Shingrix (1 of 2) Never done   MAMMOGRAM  03/27/2021   INFLUENZA VACCINE  11/23/2021   PAP SMEAR-Modifier  03/27/2022   TETANUS/TDAP  08/23/2026   COLONOSCOPY (Pts 45-59yr Insurance coverage will need to be confirmed)  08/21/2029   HIV Screening  Completed   HPV VACCINES  Aged Out      Review of Systems Patient reports no vision/ hearing changes, adenopathy,fever, weight change,  persistant/recurrent hoarseness , swallowing issues, chest pain, palpitations, edema, persistant/recurrent cough, hemoptysis, dyspnea (rest/exertional/paroxysmal nocturnal), gastrointestinal bleeding (melena, rectal bleeding), abdominal pain, significant heartburn, bowel changes, GU symptoms (dysuria, hematuria, incontinence), Gyn symptoms (abnormal  bleeding, pain),  syncope, focal weakness, memory loss, numbness & tingling, skin/hair/nail changes, abnormal bruising or bleeding.  + depression- went through a divorce, has 2 teens at home, having a hard time re-entering the dating world, started a new job.  Has been off HRT- GYN prescribed a patch and pt did not do well on these.  Wants to restart the pills.  Feels that this is contributing to depression    Objective:   Physical Exam General Appearance:    Alert, cooperative, no distress, appears stated age  Head:    Normocephalic, without obvious abnormality, atraumatic  Eyes:    PERRL, conjunctiva/corneas clear, EOM's intact both eyes  Ears:    Normal TM's and external ear  canals, both ears  Nose:   Nares normal, septum midline, mucosa normal, no drainage    or sinus tenderness  Throat:   Lips, mucosa, and tongue normal; teeth and gums normal  Neck:   Supple, symmetrical, trachea midline, no adenopathy;    Thyroid: no enlargement/tenderness/nodules  Back:     Symmetric, no curvature, ROM normal, no CVA tenderness  Lungs:     Clear to auscultation bilaterally, respirations unlabored  Chest Wall:    No tenderness or deformity   Heart:    Regular rate and rhythm, S1 and S2 normal, no murmur, rub   or gallop  Breast Exam:    Deferred to GYN  Abdomen:     Soft, non-tender, bowel sounds active all four quadrants,    no masses, no organomegaly  Genitalia:    Deferred to GYN  Rectal:    Extremities:   Extremities normal, atraumatic, no cyanosis or edema  Pulses:   2+ and symmetric all extremities  Skin:   Skin color, texture, turgor normal, no rashes or lesions  Lymph nodes:   Cervical, supraclavicular, and axillary nodes normal  Neurologic:   CNII-XII intact, normal strength, sensation and reflexes    throughout          Assessment & Plan:

## 2021-09-30 NOTE — Assessment & Plan Note (Signed)
Pt's PE WNL w/ exception of obesity.  UTD on pap, mammo, colonoscopy, Tdap.  Check labs.  Anticipatory guidance provided.  

## 2021-09-30 NOTE — Telephone Encounter (Signed)
Spoke w/ pt and advised of lab results  

## 2021-09-30 NOTE — Assessment & Plan Note (Signed)
Ongoing issue.  Pt's BMI 32.89  Encouraged low carb diet and regular exercise.  Check labs to risk stratify.  Will follow.

## 2021-10-03 ENCOUNTER — Encounter: Payer: Self-pay | Admitting: Family Medicine

## 2021-10-03 DIAGNOSIS — Z720 Tobacco use: Secondary | ICD-10-CM

## 2021-10-22 ENCOUNTER — Other Ambulatory Visit: Payer: Self-pay | Admitting: Family Medicine

## 2021-11-09 ENCOUNTER — Other Ambulatory Visit: Payer: Self-pay

## 2021-11-09 DIAGNOSIS — Z122 Encounter for screening for malignant neoplasm of respiratory organs: Secondary | ICD-10-CM

## 2021-11-09 DIAGNOSIS — Z87891 Personal history of nicotine dependence: Secondary | ICD-10-CM

## 2021-11-09 DIAGNOSIS — F17210 Nicotine dependence, cigarettes, uncomplicated: Secondary | ICD-10-CM

## 2021-11-11 ENCOUNTER — Encounter: Payer: Self-pay | Admitting: Family Medicine

## 2021-11-11 ENCOUNTER — Ambulatory Visit: Payer: Commercial Managed Care - PPO | Admitting: Family Medicine

## 2021-11-11 VITALS — BP 122/80 | HR 75 | Temp 98.3°F | Resp 16 | Ht 64.5 in | Wt 195.1 lb

## 2021-11-11 DIAGNOSIS — F32A Depression, unspecified: Secondary | ICD-10-CM

## 2021-11-11 DIAGNOSIS — F419 Anxiety disorder, unspecified: Secondary | ICD-10-CM | POA: Diagnosis not present

## 2021-11-11 DIAGNOSIS — L02214 Cutaneous abscess of groin: Secondary | ICD-10-CM

## 2021-11-11 HISTORY — DX: Cutaneous abscess of groin: L02.214

## 2021-11-11 MED ORDER — FLUOXETINE HCL 20 MG PO CAPS: 20.0000 mg | ORAL_CAPSULE | Freq: Every day | ORAL | 3 refills | Status: DC

## 2021-11-11 NOTE — Patient Instructions (Signed)
Follow up in December to recheck BP and cholesterol INCREASE the Fluoxetine to '20mg'$  daily- 2 of what you have at home, 1 of the new prescription Call with any questions or concerns HANG IN THERE!!  STAY STRONG!!!

## 2021-11-11 NOTE — Assessment & Plan Note (Signed)
Somewhat improved.  Pt reports she is less tearful.  Continues to feel overwhelmed some days.  Little to no side effects from medication.  Will increase dose to '20mg'$  daily and monitor.  Pt expressed understanding and is in agreement w/ plan.

## 2021-11-11 NOTE — Progress Notes (Signed)
   Subjective:    Patient ID: Sara Hunter, female    DOB: [birth year], 51 y.o.   MRN: [id number]  HPI Anxiety/depression- started on Fluoxetine '10mg'$  daily at last visit.  Pt reports not much of a difference.  Less tearful.  Continues to feel overwhelmed at times.  No side effects w/ exception of some loose stools.  Ex has decided 'he wants his family back'.  Pt is not interested.   Review of Systems For ROS see HPI     Objective:   Physical Exam Constitutional:      General: She is not in acute distress.    Appearance: Normal appearance. She is not ill-appearing.  HENT:     Head: Normocephalic and atraumatic.  Eyes:     Extraocular Movements: Extraocular movements intact.     Conjunctiva/sclera: Conjunctivae normal.     Pupils: Pupils are equal, round, and reactive to light.  Skin:    General: Skin is warm and dry.  Neurological:     General: No focal deficit present.     Mental Status: She is alert and oriented to person, place, and time.  Psychiatric:        Mood and Affect: Mood normal.        Behavior: Behavior normal.        Thought Content: Thought content normal.           Assessment & Plan:

## 2021-12-01 ENCOUNTER — Ambulatory Visit (INDEPENDENT_AMBULATORY_CARE_PROVIDER_SITE_OTHER): Payer: Commercial Managed Care - PPO | Admitting: Acute Care

## 2021-12-01 ENCOUNTER — Encounter: Payer: Self-pay | Admitting: Acute Care

## 2021-12-01 DIAGNOSIS — F17210 Nicotine dependence, cigarettes, uncomplicated: Secondary | ICD-10-CM | POA: Diagnosis not present

## 2021-12-01 NOTE — Progress Notes (Signed)
Virtual Visit via Telephone Note  I connected with Sara Hunter on 12/01/21 at  9:00 AM EDT by telephone and verified that I am speaking with the correct person using two identifiers.  Location: Patient:  At home Provider: [address], [place], [place], [address]    I discussed the limitations, risks, security and privacy concerns of performing an evaluation and management service by telephone and the availability of in person appointments. I also discussed with the patient that there may be a patient responsible charge related to this service. The patient expressed understanding and agreed to proceed.   Shared Decision Making Visit Lung Cancer Screening Program [phone])   Eligibility: Age 51 y.o. Pack Years Smoking History Calculation 68 pack year smoking history (# packs/per year x # years smoked) Recent History of coughing up blood  no Unexplained weight loss? no ( >Than 15 pounds within the last 6 months ) Prior History Lung / other cancer no (Diagnosis within the last 5 years already requiring surveillance chest CT Scans). Smoking Status Current Smoker Former Smokers: Years since quit: NA  Quit Date:  NA  Visit Components: Discussion included one or more decision making aids. yes Discussion included risk/benefits of screening. yes Discussion included potential follow up diagnostic testing for abnormal scans. yes Discussion included meaning and risk of over diagnosis. yes Discussion included meaning and risk of False Positives. yes Discussion included meaning of total radiation exposure. yes  Counseling Included: Importance of adherence to annual lung cancer LDCT screening. yes Impact of comorbidities on ability to participate in the program. yes Ability and willingness to under diagnostic treatment. yes  Smoking Cessation Counseling: Current Smokers:  Discussed importance of smoking cessation. yes Information about tobacco cessation classes and  interventions provided to patient. yes Patient provided with "ticket" for LDCT Scan. yes Symptomatic Patient. no  Counseling NA Diagnosis Code: Tobacco Use Z72.0 Asymptomatic Patient yes  Counseling (Intermediate counseling: > three minutes counseling) N2355 Former Smokers:  Discussed the importance of maintaining cigarette abstinence. yes Diagnosis Code: Personal History of Nicotine Dependence. D32.202 Information about tobacco cessation classes and interventions provided to patient. Yes Patient provided with "ticket" for LDCT Scan. yes Written Order for Lung Cancer Screening with LDCT placed in Epic. Yes (CT Chest Lung Cancer Screening Low Dose W/O CM) [id number] Z12.2-Screening of respiratory organs Z87.891-Personal history of nicotine dependence  I have spent 25 minutes of face to face/ virtual visit   time with  Sara Hunter discussing the risks and benefits of lung cancer screening. We viewed / discussed a power point together that explained in detail the above noted topics. We paused at intervals to allow for questions to be asked and answered to ensure understanding.We discussed that the single most powerful action that she can take to decrease her risk of developing lung cancer is to quit smoking. We discussed whether or not she is ready to commit to setting a quit date. We discussed options for tools to aid in quitting smoking including nicotine replacement therapy, non-nicotine medications, support groups, Quit Smart classes, and behavior modification. We discussed that often times setting smaller, more achievable goals, such as eliminating 1 cigarette a day for a week and then 2 cigarettes a day for a week can be helpful in slowly decreasing the number of cigarettes smoked. This allows for a sense of accomplishment as well as providing a clinical benefit. I provided  her  with smoking cessation  information  with contact information for community resources, classes, free  nicotine replacement  therapy, and access to mobile apps, text messaging, and on-line smoking cessation help. I have also provided  her  the office contact information in the event she needs to contact me, or the screening staff. We discussed the time and location of the scan, and that either Doroteo Glassman RN, Joella Prince, RN  or I will call / send a letter with the results within 24-72 hours of receiving them. The patient verbalized understanding of all of  the above and had no further questions upon leaving the office. They have my contact information in the event they have any further questions.  I spent 3 minutes counseling on smoking cessation and the health risks of continued tobacco abuse.  I explained to the patient that there has been a high incidence of coronary artery disease noted on these exams. I explained that this is a non-gated exam therefore degree or severity cannot be determined. This patient is on statin therapy. I have asked the patient to follow-up with their PCP regarding any incidental finding of coronary artery disease and management with diet or medication as their PCP  feels is clinically indicated. The patient verbalized understanding of the above and had no further questions upon completion of the visit.      Magdalen Spatz, NP 12/01/2021

## 2021-12-01 NOTE — Patient Instructions (Signed)
Thank you for participating in the [facility]. It was our pleasure to meet you today. We will call you with the results of your scan within the next few days. Your scan will be assigned a Lung RADS category score by the physicians reading the scans.  This Lung RADS score determines follow up scanning.  See below for description of categories, and follow up screening recommendations. We will be in touch to schedule your follow up screening annually or based on recommendations of our providers. We will fax a copy of your scan results to your Primary Care Physician, or the physician who referred you to the program, to ensure they have the results. Please call the office if you have any questions or concerns regarding your scanning experience or results.  Our office number is [phone]. Please speak with Denise Phelps, RN. , or  Denise Buckner RN, They are  our Lung Cancer Screening RN.'s If They are unavailable when you call, Please leave a message on the voice mail. We will return your call at our earliest convenience.This voice mail is monitored several times a day.  Remember, if your scan is normal, we will scan you annually as long as you continue to meet the criteria for the program. (Age 55-77, Current smoker or smoker who has quit within the last 15 years). If you are a smoker, remember, quitting is the single most powerful action that you can take to decrease your risk of lung cancer and other pulmonary, breathing related problems. We know quitting is hard, and we are here to help.  Please let us know if there is anything we can do to help you meet your goal of quitting. If you are a former smoker, congratulations. We are proud of you! Remain smoke free! Remember you can refer friends or family members through the number above.  We will screen them to make sure they meet criteria for the program. Thank you for helping us take better care of you by  participating in Lung Screening.  You can receive free nicotine replacement therapy ( patches, gum or mints) by calling 1-800-QUIT NOW. Please call so we can get you on the path to becoming  a non-smoker. I know it is hard, but you can do this!  Lung RADS Categories:  Lung RADS 1: no nodules or definitely non-concerning nodules.  Recommendation is for a repeat annual scan in 12 months.  Lung RADS 2:  nodules that are non-concerning in appearance and behavior with a very low likelihood of becoming an active cancer. Recommendation is for a repeat annual scan in 12 months.  Lung RADS 3: nodules that are probably non-concerning , includes nodules with a low likelihood of becoming an active cancer.  Recommendation is for a 6-month repeat screening scan. Often noted after an upper respiratory illness. We will be in touch to make sure you have no questions, and to schedule your 6-month scan.  Lung RADS 4 A: nodules with concerning findings, recommendation is most often for a follow up scan in 3 months or additional testing based on our provider's assessment of the scan. We will be in touch to make sure you have no questions and to schedule the recommended 3 month follow up scan.  Lung RADS 4 B:  indicates findings that are concerning. We will be in touch with you to schedule additional diagnostic testing based on our provider's  assessment of the scan.  Other options for assistance in smoking cessation (   As covered by your insurance benefits)  Hypnosis for smoking cessation  [facility] [employer] [phone]  Acupuncture for smoking cessation  [facility] [phone]   

## 2021-12-08 ENCOUNTER — Ambulatory Visit
Admission: RE | Admit: 2021-12-08 | Discharge: 2021-12-08 | Disposition: A | Discharge status: Home / Self Care | Payer: Commercial Managed Care - PPO | Source: Ambulatory Visit

## 2021-12-08 DIAGNOSIS — Z122 Encounter for screening for malignant neoplasm of respiratory organs: Secondary | ICD-10-CM

## 2021-12-08 DIAGNOSIS — Z87891 Personal history of nicotine dependence: Secondary | ICD-10-CM

## 2021-12-08 DIAGNOSIS — F17210 Nicotine dependence, cigarettes, uncomplicated: Secondary | ICD-10-CM

## 2021-12-10 ENCOUNTER — Other Ambulatory Visit: Payer: Self-pay | Admitting: Acute Care

## 2021-12-10 DIAGNOSIS — Z122 Encounter for screening for malignant neoplasm of respiratory organs: Secondary | ICD-10-CM

## 2021-12-10 DIAGNOSIS — Z87891 Personal history of nicotine dependence: Secondary | ICD-10-CM

## 2021-12-10 DIAGNOSIS — F17210 Nicotine dependence, cigarettes, uncomplicated: Secondary | ICD-10-CM

## 2021-12-11 ENCOUNTER — Other Ambulatory Visit: Payer: Self-pay | Admitting: Family Medicine

## 2021-12-13 ENCOUNTER — Encounter: Payer: Self-pay | Admitting: Family Medicine

## 2021-12-24 ENCOUNTER — Other Ambulatory Visit: Payer: Self-pay

## 2021-12-24 ENCOUNTER — Telehealth: Payer: Self-pay | Admitting: Family Medicine

## 2021-12-24 MED ORDER — ATORVASTATIN CALCIUM 20 MG PO TABS: 20.0000 mg | ORAL_TABLET | Freq: Every day | ORAL | 1 refills | Status: DC

## 2021-12-24 MED ORDER — ATORVASTATIN CALCIUM 20 MG PO TABS: 20.0000 mg | ORAL_TABLET | Freq: Every day | ORAL | 6 refills | Status: DC

## 2021-12-24 NOTE — Telephone Encounter (Signed)
Pt called back, [facility]. Please send to  [place], [place], [place].

## 2021-12-24 NOTE — Telephone Encounter (Signed)
Encourage patient to contact the pharmacy for refills or they can request refills through [facility]  (Please schedule appointment if patient has not been seen in over a year)    WHAT PHARMACY WOULD THEY LIKE THIS SENT TO: [facility] [address] [place], [place]  MEDICATION NAME & DOSE: Atorvastatin 20 mg   NOTES/COMMENTS FROM PATIENT: pt state that it has to be the 90 day supply because of insurance. Pt best contact number is [phone].      [place] office please notify patient: It takes 48-72 hours to process rx refill requests Ask patient to call pharmacy to ensure rx is ready before heading there.

## 2021-12-24 NOTE — Telephone Encounter (Signed)
Refill sent to pharmacy.   

## 2022-01-12 ENCOUNTER — Ambulatory Visit (INDEPENDENT_AMBULATORY_CARE_PROVIDER_SITE_OTHER): Payer: Commercial Managed Care - PPO | Admitting: Family Medicine

## 2022-01-12 ENCOUNTER — Encounter: Payer: Self-pay | Admitting: Family Medicine

## 2022-01-12 VITALS — BP 138/88 | HR 83 | Temp 98.1°F | Resp 16 | Ht 64.5 in | Wt 191.4 lb

## 2022-01-12 DIAGNOSIS — F419 Anxiety disorder, unspecified: Secondary | ICD-10-CM | POA: Diagnosis not present

## 2022-01-12 DIAGNOSIS — F32A Depression, unspecified: Secondary | ICD-10-CM

## 2022-01-12 MED ORDER — BUPROPION HCL ER (XL) 150 MG PO TB24: 150.0000 mg | ORAL_TABLET | Freq: Every day | ORAL | 3 refills | Status: DC

## 2022-01-12 NOTE — Progress Notes (Signed)
   Subjective:    Patient ID: Sara Hunter, female    DOB: Dec 13, 1970, 51 y.o.   MRN: [id number]  HPI Anxiety/Depression- pt increased her Prozac to '40mg'$  daily.  'i am very mellow.  I don't care much about anything'.  Has very little motivation, low energy.  Pt is interested in starting counseling.     Review of Systems For ROS see HPI     Objective:   Physical Exam Vitals reviewed.  Constitutional:      General: She is not in acute distress.    Appearance: Normal appearance. She is not ill-appearing.  HENT:     Head: Normocephalic and atraumatic.  Skin:    General: Skin is warm and dry.  Neurological:     General: No focal deficit present.     Mental Status: She is alert and oriented to person, place, and time.  Psychiatric:        Mood and Affect: Mood normal.        Behavior: Behavior normal.        Thought Content: Thought content normal.           Assessment & Plan:

## 2022-01-12 NOTE — Patient Instructions (Signed)
Follow up in 4-6 weeks to recheck mood Decrease the Fluoxetine to '20mg'$  daily ADD the Bupropion (Wellbutrin) '150mg'$  daily Call with any questions or concerns Stay Safe!  Stay Healthy! Hang in there!

## 2022-01-15 ENCOUNTER — Encounter: Payer: Self-pay | Admitting: Family Medicine

## 2022-01-16 NOTE — Assessment & Plan Note (Signed)
Pt continues to struggle w/ low motivation and low energy.  Will add Wellbutrin to her Fluoxetine dose but will decrease fluoxetine back to '20mg'$  daily.  Referral placed for counseling.  Will follow.

## 2022-02-05 ENCOUNTER — Other Ambulatory Visit: Payer: Self-pay | Admitting: Family Medicine

## 2022-03-18 ENCOUNTER — Encounter: Payer: Self-pay | Admitting: Family Medicine

## 2022-03-28 ENCOUNTER — Other Ambulatory Visit (HOSPITAL_COMMUNITY)
Admission: RE | Admit: 2022-03-28 | Discharge: 2022-03-28 | Disposition: A | Discharge status: Home / Self Care | Payer: Commercial Managed Care - PPO | Source: Ambulatory Visit | Attending: Family Medicine | Admitting: Family Medicine

## 2022-03-28 ENCOUNTER — Ambulatory Visit: Payer: Commercial Managed Care - PPO | Admitting: Family Medicine

## 2022-03-28 ENCOUNTER — Encounter: Payer: Self-pay | Admitting: Family Medicine

## 2022-03-28 VITALS — BP 140/80 | HR 78 | Temp 97.7°F | Ht 64.5 in | Wt 185.0 lb

## 2022-03-28 DIAGNOSIS — F419 Anxiety disorder, unspecified: Secondary | ICD-10-CM | POA: Diagnosis not present

## 2022-03-28 DIAGNOSIS — I10 Essential (primary) hypertension: Secondary | ICD-10-CM | POA: Diagnosis not present

## 2022-03-28 DIAGNOSIS — Z202 Contact with and (suspected) exposure to infections with a predominantly sexual mode of transmission: Secondary | ICD-10-CM | POA: Insufficient documentation

## 2022-03-28 DIAGNOSIS — E785 Hyperlipidemia, unspecified: Secondary | ICD-10-CM

## 2022-03-28 DIAGNOSIS — F32A Depression, unspecified: Secondary | ICD-10-CM

## 2022-03-28 HISTORY — DX: Hyperlipidemia, unspecified: E78.5

## 2022-03-28 LAB — HEPATIC FUNCTION PANEL
ALT: 23 U/L (ref 0–35)
AST: 16 U/L (ref 0–37)
Albumin: 4.3 g/dL (ref 3.5–5.2)
Alkaline Phosphatase: 60 U/L (ref 39–117)
Bilirubin, Direct: 0.1 mg/dL (ref 0.0–0.3)
Total Bilirubin: 0.6 mg/dL (ref 0.2–1.2)
Total Protein: 6.6 g/dL (ref 6.0–8.3)

## 2022-03-28 LAB — LIPID PANEL
Cholesterol: 116 mg/dL (ref 0–200)
HDL: 45.2 mg/dL (ref >39.00)
LDL Cholesterol: 52 mg/dL (ref 0–99)
NonHDL: 70.75
Total CHOL/HDL Ratio: 3
Triglycerides: 95 mg/dL (ref 0.0–149.0)
VLDL: 19 mg/dL (ref 0.0–40.0)

## 2022-03-28 LAB — CBC WITH DIFFERENTIAL/PLATELET
Basophils Absolute: 0.1 10*3/uL (ref 0.0–0.1)
Basophils Relative: 0.8 % (ref 0.0–3.0)
Eosinophils Absolute: 0.3 10*3/uL (ref 0.0–0.7)
Eosinophils Relative: 5.2 % — ABNORMAL HIGH (ref 0.0–5.0)
HCT: 42 % (ref 36.0–46.0)
Hemoglobin: 14.1 g/dL (ref 12.0–15.0)
Lymphocytes Relative: 26.3 % (ref 12.0–46.0)
Lymphs Abs: 1.7 10*3/uL (ref 0.7–4.0)
MCHC: 33.7 g/dL (ref 30.0–36.0)
MCV: 94 fl (ref 78.0–100.0)
Monocytes Absolute: 0.3 10*3/uL (ref 0.1–1.0)
Monocytes Relative: 4.4 % (ref 3.0–12.0)
Neutro Abs: 4 10*3/uL (ref 1.4–7.7)
Neutrophils Relative %: 63.3 % (ref 43.0–77.0)
Platelets: 215 10*3/uL (ref 150.0–400.0)
RBC: 4.46 Mil/uL (ref 3.87–5.11)
RDW: 13.4 % (ref 11.5–15.5)
WBC: 6.3 10*3/uL (ref 4.0–10.5)

## 2022-03-28 LAB — TSH: TSH: 2.39 u[IU]/mL (ref 0.35–5.50)

## 2022-03-28 LAB — BASIC METABOLIC PANEL
BUN: 10 mg/dL (ref 6–23)
CO2: 28 mEq/L (ref 19–32)
Calcium: 9 mg/dL (ref 8.4–10.5)
Chloride: 105 mEq/L (ref 96–112)
Creatinine, Ser: 0.7 mg/dL (ref 0.40–1.20)
GFR: 98.97 mL/min (ref >60.00)
Glucose, Bld: 137 mg/dL — ABNORMAL HIGH (ref 70–99)
Potassium: 3.3 mEq/L — ABNORMAL LOW (ref 3.5–5.1)
Sodium: 141 mEq/L (ref 135–145)

## 2022-03-28 MED ORDER — FLUOXETINE HCL 40 MG PO CAPS: 40.0000 mg | ORAL_CAPSULE | Freq: Every day | ORAL | 1 refills | Status: DC

## 2022-03-28 MED ORDER — BUPROPION HCL ER (XL) 300 MG PO TB24: 300.0000 mg | ORAL_TABLET | Freq: Every day | ORAL | 1 refills | Status: DC

## 2022-03-28 NOTE — Assessment & Plan Note (Signed)
Chronic problem.  Currently on Lipitor 26m daily w/o difficulty.  Check labs.  Adjust meds prn

## 2022-03-28 NOTE — Assessment & Plan Note (Signed)
Deteriorated.  Today pt's PHQ9=18 despite Wellbutrin '150mg'$  daily and Prozac '40mg'$  daily.  Will increase Wellbutrin to '300mg'$  daily and monitor closely for improvement.  Pt expressed understanding and is in agreement w/ plan.
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
## 2022-03-28 NOTE — Patient Instructions (Addendum)
Follow up in 4-6 weeks to recheck mood We'll notify you of your lab results and make any changes if needed INCREASE the Wellbutrin to '300mg'$ - 2 pills of your current medication and 1 of the new prescription CONTINUE the Fluoxetine '40mg'$  daily- 2 of what you currently have and 1 of the new prescription Continue to work on healthy diet and regular exercise- you can do it! Try and cut back on your smoking- you can do it! Call with any questions or concerns Stay Safe!  Stay Healthy! Hang in there! Happy Holidays!!!
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
## 2022-03-29 ENCOUNTER — Telehealth: Payer: Self-pay

## 2022-03-29 ENCOUNTER — Other Ambulatory Visit (INDEPENDENT_AMBULATORY_CARE_PROVIDER_SITE_OTHER): Payer: Commercial Managed Care - PPO

## 2022-03-29 ENCOUNTER — Other Ambulatory Visit: Payer: Self-pay

## 2022-03-29 DIAGNOSIS — R7309 Other abnormal glucose: Secondary | ICD-10-CM | POA: Diagnosis not present

## 2022-03-29 LAB — URINE CYTOLOGY ANCILLARY ONLY
Chlamydia: NEGATIVE
Comment: NEGATIVE
Comment: NEGATIVE
Comment: NORMAL
Neisseria Gonorrhea: NEGATIVE
Trichomonas: NEGATIVE

## 2022-03-29 LAB — HSV(HERPES SIMPLEX VRS) I + II AB-IGG
HAV 1 IGG,TYPE SPECIFIC AB: 2.14 index — ABNORMAL HIGH
HSV 2 IGG,TYPE SPECIFIC AB: 4.83 index — ABNORMAL HIGH

## 2022-03-29 LAB — HEMOGLOBIN A1C: Hgb A1c MFr Bld: 6.1 % (ref 4.6–6.5)

## 2022-03-29 LAB — HIV ANTIBODY (ROUTINE TESTING W REFLEX): HIV 1&2 Ab, 4th Generation: NONREACTIVE

## 2022-03-29 LAB — RPR: RPR Ser Ql: NONREACTIVE

## 2022-03-29 NOTE — Telephone Encounter (Signed)
-----   Message from Midge Minium, MD sent at 03/29/2022  7:31 AM EST ----- Potassium is mildly low.  This will increase w/ a diet high in potassium (citrus fruits, bananas, OJ, leafy greens) and a daily multivitamin  Sugar is elevated.  I don't think you were fasting but we will add an A1C to assess for possible diabetes.  Remainder of labs look great!  Still waiting on STD tests

## 2022-03-29 NOTE — Telephone Encounter (Signed)
Pt seen results Via my chart . Sent the A1C to the lab

## 2022-04-10 ENCOUNTER — Other Ambulatory Visit: Payer: Self-pay | Admitting: Family Medicine

## 2022-04-26 ENCOUNTER — Other Ambulatory Visit: Payer: Self-pay

## 2022-04-26 ENCOUNTER — Telehealth: Payer: Self-pay | Admitting: Family Medicine

## 2022-04-26 MED ORDER — VALSARTAN 80 MG PO TABS: 80.0000 mg | ORAL_TABLET | Freq: Every day | ORAL | 1 refills | Status: DC

## 2022-04-26 MED ORDER — CETIRIZINE HCL 10 MG PO TABS: 10.0000 mg | ORAL_TABLET | Freq: Every day | ORAL | 3 refills | Status: DC

## 2022-04-26 NOTE — Telephone Encounter (Signed)
Encourage patient to contact the pharmacy for refills or they can request refills through [facility]  (Please schedule appointment if patient has not been seen in over a year)  Last ov was 03/28/22   WHAT PHARMACY WOULD THEY LIKE THIS SENT TO: [place], [place] 10 mg and valsartan 80 mg   NOTES/COMMENTS FROM PATIENT: Tina from [facility] called in two refills for this patient.      [place] office please notify patient: It takes 48-72 hours to process rx refill requests Ask patient to call pharmacy to ensure rx is ready before heading there.

## 2022-04-26 NOTE — Telephone Encounter (Signed)
Sent refill

## 2022-05-02 ENCOUNTER — Encounter: Payer: Self-pay | Admitting: Family Medicine

## 2022-05-02 ENCOUNTER — Ambulatory Visit: Payer: Commercial Managed Care - PPO | Admitting: Family Medicine

## 2022-05-02 VITALS — BP 136/84 | HR 74 | Temp 97.8°F | Resp 17 | Ht 64.5 in | Wt 185.2 lb

## 2022-05-02 DIAGNOSIS — F32A Depression, unspecified: Secondary | ICD-10-CM

## 2022-05-02 DIAGNOSIS — F419 Anxiety disorder, unspecified: Secondary | ICD-10-CM

## 2022-05-02 NOTE — Assessment & Plan Note (Signed)
Improved w/ increased dose of Wellbutrin.  Will continue '300mg'$  daily and Fluoxetine '40mg'$  daily.  Encouraged her to call and schedule counseling to work through some of this emotional trauma and move past it to the happiness she deserves.  Will follow.

## 2022-05-02 NOTE — Progress Notes (Signed)
   Subjective:    Patient ID: Sara Hunter, female    DOB: [birth year], 52 y.o.   MRN: [id number]  HPI Anxiety/Depression- at last visit Wellbutrin was increased to '300mg'$  daily.  Also on Fluoxetine '40mg'$  daily.  Pt feels that the higher dose of Wellbutrin is helping.  'i'm doing better than the last time you saw me'.  Feels less tearflu, less overwhelmed.  Pt is not currently in counseling but plans to.   Review of Systems For ROS see HPI     Objective:   Physical Exam Vitals reviewed.  Constitutional:      General: She is not in acute distress.    Appearance: Normal appearance. She is not ill-appearing.  HENT:     Head: Normocephalic and atraumatic.  Skin:    General: Skin is warm and dry.  Neurological:     General: No focal deficit present.     Mental Status: She is alert and oriented to person, place, and time.  Psychiatric:        Mood and Affect: Mood normal.        Behavior: Behavior normal.        Thought Content: Thought content normal.           Assessment & Plan:

## 2022-05-02 NOTE — Patient Instructions (Signed)
Schedule your complete physical for June No med changes at this time- you're doing great! Call and schedule counseling- more for your benefit than anyone else's Call with any questions or concerns Stay Safe!  Stay Healthy! Happy New Year!!!

## 2022-05-18 ENCOUNTER — Telehealth: Payer: Self-pay | Admitting: Family Medicine

## 2022-05-18 ENCOUNTER — Other Ambulatory Visit: Payer: Self-pay

## 2022-05-18 DIAGNOSIS — I10 Essential (primary) hypertension: Secondary | ICD-10-CM

## 2022-05-18 MED ORDER — VALSARTAN 80 MG PO TABS: 80.0000 mg | ORAL_TABLET | Freq: Every day | ORAL | 1 refills | Status: DC

## 2022-05-18 NOTE — Telephone Encounter (Signed)
Rx refill sent to pharmacy. 

## 2022-05-18 NOTE — Telephone Encounter (Signed)
Caller name: JENNISE BOTH  On [place]?: Yes  Call back number: [phone] (mobile)  Provider they see: Midge Minium, MD  Reason for call:  Patient wants her medication "valsartan (DIOVAN) 80 MG tablet" switched to [place], [place]

## 2022-06-01 LAB — HM MAMMOGRAPHY

## 2022-06-08 ENCOUNTER — Encounter: Payer: Self-pay | Admitting: Family Medicine

## 2022-06-08 DIAGNOSIS — I10 Essential (primary) hypertension: Secondary | ICD-10-CM

## 2022-06-10 ENCOUNTER — Encounter: Payer: Self-pay | Admitting: Family Medicine

## 2022-06-10 ENCOUNTER — Ambulatory Visit (INDEPENDENT_AMBULATORY_CARE_PROVIDER_SITE_OTHER): Payer: Commercial Managed Care - PPO | Admitting: Family Medicine

## 2022-06-10 VITALS — BP 160/100 | HR 86 | Temp 97.9°F | Resp 17 | Ht 68.0 in | Wt 185.0 lb

## 2022-06-10 DIAGNOSIS — G252 Other specified forms of tremor: Secondary | ICD-10-CM | POA: Insufficient documentation

## 2022-06-10 DIAGNOSIS — I10 Essential (primary) hypertension: Secondary | ICD-10-CM | POA: Diagnosis not present

## 2022-06-10 HISTORY — DX: Other specified forms of tremor: G25.2

## 2022-06-10 MED ORDER — METOPROLOL SUCCINATE ER 25 MG PO TB24: 25.0000 mg | ORAL_TABLET | Freq: Every day | ORAL | 3 refills | Status: DC

## 2022-06-10 NOTE — Patient Instructions (Signed)
Follow up in 2-3 weeks to recheck BP and tremor START the Metoprolol once daily CONTINUE the other meds w/o changes Try and limit your salt intake INCREASE your water intake Call with any questions or concerns Hang in there!!!

## 2022-06-10 NOTE — Progress Notes (Unsigned)
   Subjective:    Patient ID: Sara Hunter, female    DOB: 04-09-1971, 52 y.o.   MRN: [id number]  HPI HTN- pt reports BP 'has been going crazy'.  BP was high at GYN office in late January- '160 over something'.  Home BP this AM 167/106, last night was 175/106.  No CP, SOB, HA's, visual changes, edema.  No palpitations.  Currently on Valsartan 16m daily.  Sister passed last week unexpectedly.  No change in salt intake.  Tremor- pt reports sxs are intermittent.  Noticed about 3-4 weeks ago.  Much more noticeable when trying to lift a glass or hold something.  L>R.  No hx of tremor previously.  Pt has had extremely stressful time recently   Review of Systems For ROS see HPI     Objective:   Physical Exam Vitals reviewed.  Constitutional:      General: She is not in acute distress.    Appearance: Normal appearance. She is well-developed. She is not ill-appearing.  HENT:     Head: Normocephalic and atraumatic.  Eyes:     Conjunctiva/sclera: Conjunctivae normal.     Pupils: Pupils are equal, round, and reactive to light.  Neck:     Thyroid: No thyromegaly.  Cardiovascular:     Rate and Rhythm: Normal rate and regular rhythm.     Heart sounds: Normal heart sounds. No murmur heard. Pulmonary:     Effort: Pulmonary effort is normal. No respiratory distress.     Breath sounds: Normal breath sounds.  Abdominal:     General: There is no distension.     Palpations: Abdomen is soft.     Tenderness: There is no abdominal tenderness.  Musculoskeletal:     Cervical back: Normal range of motion and neck supple.  Lymphadenopathy:     Cervical: No cervical adenopathy.  Skin:    General: Skin is warm and dry.  Neurological:     General: No focal deficit present.     Mental Status: She is alert and oriented to person, place, and time.     Comments: Very mild, fine tremor of L hand  Psychiatric:        Mood and Affect: Mood normal.        Behavior: Behavior normal.            Assessment & Plan:

## 2022-06-12 NOTE — Assessment & Plan Note (Signed)
Deteriorated.  Pt was previously well controlled on Vasartan 56m daily.  Now having elevated BP's.  Thankfully asymptomatic but BP has been as high was 175/106.  Given that she is also having high stress levels and new intention tremor, will start Metoprolol XL 223mdaily and monitor closely for improvement.  If BP continues to run high will increase Valsartan at next visit.  Pt expressed understanding and is in agreement w/ plan.

## 2022-06-12 NOTE — Assessment & Plan Note (Signed)
New.  Very faint tremor of L hand seen today.  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  No pill rolling seen on PE.  Given elevated BP and anxiety, will start tx w/ Metoprolol XL 20m daily.  Pt expressed understanding and is in agreement w/ plan.

## 2022-06-14 NOTE — Addendum Note (Signed)
Addended by: Midge Minium on: 06/14/2022 10:48 AM   Modules accepted: Orders

## 2022-06-14 NOTE — Telephone Encounter (Signed)
Pt reports did try doubling up on he med and notes she has had a headache since yesterday, and was advised to get an [place] due to concerns for potential blockage  Please advise

## 2022-06-21 NOTE — Telephone Encounter (Signed)
Can you place a STAT order so it will be processed quicker now that we have to change locations, patient is becoming anxious and reports higher readings of BP,

## 2022-06-21 NOTE — Addendum Note (Signed)
Addended by: Midge Minium on: 06/21/2022 08:35 AM   Modules accepted: Orders

## 2022-06-21 NOTE — Telephone Encounter (Signed)
Pt is scheduled for 2/28 at 68 at [facility], I have LM with this information and sent her a mychart message as well.

## 2022-06-22 ENCOUNTER — Ambulatory Visit (HOSPITAL_COMMUNITY)
Admission: RE | Admit: 2022-06-22 | Discharge: 2022-06-22 | Disposition: A | Discharge status: Home / Self Care | Payer: Commercial Managed Care - PPO | Source: Ambulatory Visit | Attending: Family Medicine | Admitting: Family Medicine

## 2022-06-22 ENCOUNTER — Ambulatory Visit (HOSPITAL_COMMUNITY): Payer: Commercial Managed Care - PPO

## 2022-06-22 DIAGNOSIS — I10 Essential (primary) hypertension: Secondary | ICD-10-CM | POA: Diagnosis present

## 2022-06-22 NOTE — Progress Notes (Signed)
Call patient to go over Ultrasound results, no answer left voicemail to call office back.  [place]  P:[phone]

## 2022-06-23 ENCOUNTER — Other Ambulatory Visit: Payer: Self-pay

## 2022-06-23 ENCOUNTER — Telehealth: Payer: Self-pay

## 2022-06-23 DIAGNOSIS — I701 Atherosclerosis of renal artery: Secondary | ICD-10-CM

## 2022-06-23 NOTE — Telephone Encounter (Signed)
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  This is quite a large range and to figure out how best to proceed, I'm going to refer you to Vascular Surgery (dx renal artery stenosis) for a complete evaluation and possible treatment

## 2022-06-23 NOTE — Telephone Encounter (Signed)
Informed pt of [place] results  and the referral for the Vascular surgery has been placed

## 2022-06-24 ENCOUNTER — Encounter: Payer: Self-pay | Admitting: Family Medicine

## 2022-06-24 ENCOUNTER — Ambulatory Visit: Payer: Commercial Managed Care - PPO | Admitting: Family Medicine

## 2022-06-24 VITALS — BP 136/84 | HR 74 | Temp 98.4°F | Resp 16 | Ht 68.0 in | Wt 186.1 lb

## 2022-06-24 DIAGNOSIS — I10 Essential (primary) hypertension: Secondary | ICD-10-CM

## 2022-06-24 LAB — BASIC METABOLIC PANEL
BUN: 18 mg/dL (ref 6–23)
CO2: 28 mEq/L (ref 19–32)
Calcium: 9.6 mg/dL (ref 8.4–10.5)
Chloride: 107 mEq/L (ref 96–112)
Creatinine, Ser: 0.92 mg/dL (ref 0.40–1.20)
GFR: 71.17 mL/min (ref >60.00)
Glucose, Bld: 113 mg/dL — ABNORMAL HIGH (ref 70–99)
Potassium: 4.3 mEq/L (ref 3.5–5.1)
Sodium: 142 mEq/L (ref 135–145)

## 2022-06-24 MED ORDER — METOPROLOL SUCCINATE ER 50 MG PO TB24: 50.0000 mg | ORAL_TABLET | Freq: Every day | ORAL | 3 refills | Status: DC

## 2022-06-24 MED ORDER — VALSARTAN 160 MG PO TABS: 160.0000 mg | ORAL_TABLET | Freq: Every day | ORAL | 3 refills | Status: DC

## 2022-06-24 NOTE — Progress Notes (Signed)
   Subjective:    Patient ID: Sara Hunter, female    DOB: [birth year], 52 y.o.   MRN: [id number]  HPI HTN- at last visit BP was elevated and we started Metoprolol '25mg'$  daily.  BP continued to run high at home and work so we doubled the Metoprolol to '50mg'$  and doubled the Valsartan to '160mg'$  daily.  No CP, SOB.  HA's are better.  No swelling.   Review of Systems For ROS see HPI     Objective:   Physical Exam Vitals reviewed.  Constitutional:      General: She is not in acute distress.    Appearance: Normal appearance. She is well-developed. She is not ill-appearing.  HENT:     Head: Normocephalic and atraumatic.  Eyes:     Conjunctiva/sclera: Conjunctivae normal.     Pupils: Pupils are equal, round, and reactive to light.  Neck:     Thyroid: No thyromegaly.  Cardiovascular:     Rate and Rhythm: Normal rate and regular rhythm.     Pulses: Normal pulses.     Heart sounds: Normal heart sounds. No murmur heard. Pulmonary:     Effort: Pulmonary effort is normal. No respiratory distress.     Breath sounds: Normal breath sounds.  Abdominal:     General: There is no distension.     Palpations: Abdomen is soft.     Tenderness: There is no abdominal tenderness.  Musculoskeletal:     Cervical back: Normal range of motion and neck supple.     Right lower leg: No edema.     Left lower leg: No edema.  Lymphadenopathy:     Cervical: No cervical adenopathy.  Skin:    General: Skin is warm and dry.  Neurological:     Mental Status: She is alert and oriented to person, place, and time.  Psychiatric:        Behavior: Behavior normal.           Assessment & Plan:

## 2022-06-24 NOTE — Assessment & Plan Note (Signed)
Improving.  BP is better controlled w/ increased doses of Metoprolol and Valsartan.  Has appt w/ Vascular on Monday to discuss RAS.  Check BMP due to increased ARB dose.  Will continue to follow closely.

## 2022-06-24 NOTE — Patient Instructions (Signed)
Follow up as needed or as scheduled CONTINUE the Metoprolol at '50mg'$  daily and the Valsartan at '160mg'$  daily Continue to drink LOTS of water and limit your salt Call with any questions or concerns Hang in there!!!

## 2022-06-27 ENCOUNTER — Ambulatory Visit: Payer: Commercial Managed Care - PPO | Admitting: Vascular Surgery

## 2022-06-27 VITALS — BP 160/90 | HR 74 | Temp 98.3°F | Resp 16 | Ht 64.0 in | Wt 188.0 lb

## 2022-06-27 DIAGNOSIS — I701 Atherosclerosis of renal artery: Secondary | ICD-10-CM | POA: Diagnosis not present

## 2022-06-27 NOTE — Progress Notes (Signed)
[facility] [place]  ASSESSMENT / PLAN: 52 y.o. female with mild unilateral right renal artery stenosis.  I counseled the patient extensively about renovascular hypertension.  I explained that I do not think unilateral stenosis can explain her hard-to-control blood pressure.  I will plan to reimage this in 6 months to ensure she is not rapidly progressing renal artery stenosis.  More worrisome to me is her early burden of atherosclerotic plaque.  I think she would benefit from referral to Dr. Debara Pickett for preventative evaluation.  She should continue best medical therapy for atherosclerotic disease:  Complete cessation from all tobacco products. Blood glucose control with goal A1c < 7%. Blood pressure control with goal blood pressure < 140/90 mmHg. Lipid reduction therapy with goal LDL-C <100 mg/dL (<70 if symptomatic from PAD).  Aspirin '81mg'$  PO QD.  Atorvastatin 40-'80mg'$  PO QD (or other "high intensity" statin therapy).  I will see her again in 6 months with renal artery duplex and carotid duplex  CHIEF COMPLAINT: Right renal artery stenosis  HISTORY OF PRESENT ILLNESS: Sara Hunter is a 52 y.o. female referred to clinic for evaluation of right renal artery stenosis.  She has been under the care of Dr. Birdie Riddle for difficult to control high blood pressure.  Her blood pressure has been as high as [id number] systolic.  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  She is with her friend and coworker today.  We reviewed the findings of her recent renal artery duplex.  I counseled her about renovascular hypertension.  We made a plan for surveillance together.  PMH: HTN  Past Surgical History:  Procedure Laterality Date   APPENDECTOMY     cyst remover on tailbone      Family History  Problem Relation Age of Onset   Hypertension Mother    Diabetes Mother    COPD Mother    Hypertension Father    Heart disease Father    Hypertension Sister    Diabetes  Sister    Kidney disease Sister    Hypertension Sister    Stroke Sister    Kidney disease Sister     Social History   Socioeconomic History   Marital status: Divorced    Spouse name: Not on file   Number of children: Not on file   Years of education: Not on file   Highest education level: Not on file  Occupational History   Not on file  Tobacco Use   Smoking status: Every Day    Packs/day: 1.00    Types: Cigarettes   Smokeless tobacco: Current  Vaping Use   Vaping Use: Never used  Substance and Sexual Activity   Alcohol use: Yes    Comment: occ. wine   Drug use: No   Sexual activity: Not on file  Other Topics Concern   Not on file  Social History Narrative   Not on file   Social Determinants of Health   Financial Resource Strain: Not on file  Food Insecurity: Not on file  Transportation Needs: Not on file  Physical Activity: Not on file  Stress: Not on file  Social Connections: Not on file  Intimate Partner Violence: Not on file    No Active Allergies  Current Outpatient Medications  Medication Sig Dispense Refill   aspirin EC 81 MG tablet Take 81 mg by mouth daily. Swallow whole.     atorvastatin (LIPITOR) 20 MG tablet Take 1 tablet (20 mg total) by mouth daily.  90 tablet 1   buPROPion (WELLBUTRIN XL) 300 MG 24 hr tablet Take 1 tablet (300 mg total) by mouth daily. 90 tablet 1   cetirizine (ZYRTEC) 10 MG tablet Take 1 tablet (10 mg total) by mouth daily. 90 tablet 3   FLUoxetine (PROZAC) 40 MG capsule Take 1 capsule (40 mg total) by mouth daily. 90 capsule 1   metoprolol succinate (TOPROL-XL) 50 MG 24 hr tablet Take 1 tablet (50 mg total) by mouth daily. Take with or immediately following a meal. 90 tablet 3   norethindrone-ethinyl estradiol (FYAVOLV) 1-5 MG-MCG TABS tablet Take 1 tablet by mouth daily.     PREMPRO 0.625-2.5 MG tablet Take 1 tablet by mouth daily.     valsartan (DIOVAN) 160 MG tablet Take 1 tablet (160 mg total) by mouth daily. 90 tablet 3    No current facility-administered medications for this visit.    PHYSICAL EXAM Vitals:   06/27/22 0839  BP: (!) 160/90  Pulse: 74  Resp: 16  Temp: 98.3 F (36.8 C)  SpO2: 97%  Weight: 188 lb (85.3 kg)  Height: '5\' 4"'$  (1.626 m)   Well-appearing woman in no acute distress Regular rate and rhythm Unlabored breathing 2+ radial pulses bilaterally 2+ DP pulses bilaterally   PERTINENT LABORATORY AND RADIOLOGIC DATA  Most recent CBC    Latest Ref Rng & Units 03/28/2022    8:35 AM 09/30/2021    8:21 AM 09/11/2020    1:13 PM  CBC  WBC 4.0 - 10.5 K/uL 6.3  5.7  11.2   Hemoglobin 12.0 - 15.0 g/dL 14.1  15.2  14.4   Hematocrit 36.0 - 46.0 % 42.0  44.3  42.4   Platelets 150.0 - 400.0 K/uL 215.0  177.0  221.0      Most recent CMP    Latest Ref Rng & Units 06/24/2022    1:45 PM 03/28/2022    8:35 AM 09/30/2021    8:21 AM  CMP  Glucose 70 - 99 mg/dL 113  137  103   BUN 6 - 23 mg/dL '18  10  13   '$ Creatinine 0.40 - 1.20 mg/dL 0.92  0.70  0.72   Sodium 135 - 145 mEq/L 142  141  142   Potassium 3.5 - 5.1 mEq/L 4.3  3.3  3.9   Chloride 96 - 112 mEq/L 107  105  107   CO2 19 - 32 mEq/L '28  28  26   '$ Calcium 8.4 - 10.5 mg/dL 9.6  9.0  9.5   Total Protein 6.0 - 8.3 g/dL  6.6  6.6   Total Bilirubin 0.2 - 1.2 mg/dL  0.6  0.9   Alkaline Phos 39 - 117 U/L  60  68   AST 0 - 37 U/L  16  27   ALT 0 - 35 U/L  23  46     Renal function Estimated Creatinine Clearance: 74.7 mL/min (by C-G formula based on SCr of 0.92 mg/dL).  Hgb A1c MFr Bld (%)  Date Value  03/29/2022 6.1    LDL Cholesterol  Date Value Ref Range Status  03/28/2022 52 0 - 99 mg/dL Final   Direct LDL  Date Value Ref Range Status  08/26/2019 155.0 mg/dL Final    Comment:    Optimal:  <100 mg/dLNear or Above Optimal:  100-129 mg/dLBorderline High:  130-159 mg/dLHigh:  160-189 mg/dLVery High:  >190 mg/dL     Renal:    Right: 1-59% stenosis of the right renal artery.  Proximal renal         artery PSV of 157 cm/s. Normal  right Resisitive Index. Normal         size right kidney. RRV flow present.  Left:  No evidence of left renal artery stenosis. Normal left         Resistive Index. Normal size of left kidney. LRV flow         present.     On personal review of duplex, I feel this is on the milder end of the widely reported range.   Sara Hunter. Stanford Breed, MD FACS [facility] Phone Number: [phone] 06/27/2022 9:59 AM   Total time spent on preparing this encounter including chart review, data review, collecting history, examining the patient, coordinating care for this new patient, 60 minutes.  Portions of this report may have been transcribed using voice recognition software.  Every effort has been made to ensure accuracy; however, inadvertent computerized transcription errors may still be present.

## 2022-06-28 ENCOUNTER — Other Ambulatory Visit: Payer: Self-pay

## 2022-06-28 DIAGNOSIS — I701 Atherosclerosis of renal artery: Secondary | ICD-10-CM

## 2022-06-28 DIAGNOSIS — Z8249 Family history of ischemic heart disease and other diseases of the circulatory system: Secondary | ICD-10-CM

## 2022-06-28 DIAGNOSIS — E785 Hyperlipidemia, unspecified: Secondary | ICD-10-CM

## 2022-07-10 ENCOUNTER — Other Ambulatory Visit: Payer: Self-pay | Admitting: Family Medicine

## 2022-07-11 NOTE — Telephone Encounter (Signed)
Left VM for pt stating she needs to contact her OB /GYN

## 2022-07-11 NOTE — Telephone Encounter (Signed)
This would need to come from her GYN as it says she's also on Premarin.  And that's 2 estrogen containing compounds.  It appears she just saw her in Feb

## 2022-07-11 NOTE — Telephone Encounter (Signed)
Is this ok to refill ? It says historical provider I wanted to be sure we could refill it

## 2022-07-14 ENCOUNTER — Encounter: Payer: Self-pay | Admitting: Family Medicine

## 2022-07-14 DIAGNOSIS — I701 Atherosclerosis of renal artery: Secondary | ICD-10-CM

## 2022-07-14 DIAGNOSIS — E785 Hyperlipidemia, unspecified: Secondary | ICD-10-CM

## 2022-07-15 ENCOUNTER — Encounter: Payer: Self-pay | Admitting: Family Medicine

## 2022-08-16 NOTE — Progress Notes (Unsigned)
Cardiology Office Note:    Date:  08/17/2022   ID:  Sara Hunter, DOB 02-20-1971, MRN [id number]  PCP:  Sheliah Hatch, MD  Cardiologist:  Norman Herrlich, MD   Referring MD: Sheliah Hatch, MD  ASSESSMENT:    1. Primary hypertension   2. Mixed hyperlipidemia   3. Renal artery stenosis   4. Cigarette smoker    PLAN:    In order of problems listed above:  Her blood pressure has been difficult to control and reach target probably best described initially as stage II in general these individuals do best with combination ARB diuretic and I will switch her to a more potent blood pressure lowering beta-blocker Bystolic with a goal blood pressure less than 130/80.  She is good technique validated device check blood pressures daily and bring a list in 2 weeks when she returns back to check renal function potassium as well as a lipid profile and LP(a).  I do not think she needs to be screened for sleep apnea. 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  They are offering a smoking cessation class especially if she has findings of emphysema chronic bronchitis and a chronic cough  Next appointment 1 month   Medication Adjustments/Labs and Tests Ordered: Current medicines are reviewed at length with the patient today.  Concerns regarding medicines are outlined above.  Orders Placed This Encounter  Procedures   Comp Met (CMET)   Lipid Profile   Lipoprotein A (LPA)   EKG 12-Lead   Meds ordered this encounter  Medications   valsartan-hydrochlorothiazide (DIOVAN HCT) 160-12.5 MG tablet    Sig: Take 1 tablet by mouth daily.    Dispense:  90 tablet    Refill:   3   nebivolol (BYSTOLIC) 5 MG tablet    Sig: Take 1 tablet (5 mg total) by mouth daily.    Dispense:  90 tablet    Refill:  3     I was sent here blood pressure  History of Present Illness:    Sara Hunter is a 52 y.o. female with a history of renal artery stenosis and difficult to control hypertension who is being seen today for the evaluation of hyperlipidemia at the request of Sheliah Hatch, MD.  Lipid profile January 2014 showed cholesterol 175 LDL 119 HDL 33 non-HDL cholesterol 142.  I reviewed the note from vascular surgery and thought she was here to be evaluated for lipid disorder. She tells me she was referred blood pressure She has had hypertension for 2 years and has a background history of hypertensive disorder pregnancy. She takes her medication she is in the pillbox she sodium restricts there is no alcohol abuse she does snore but not severely and does not have other symptoms of sleep apnea Her renal artery stenosis is not flow-limiting .  She has been taking an ARB and recently was placed on metoprolol She has an arm cuff and uses good technique for blood pressure at home ambulatory check History reviewed. No pertinent past medical history.  Past Surgical History:  Procedure Laterality Date   APPENDECTOMY     cyst remover on tailbone  Current Medications: Current Meds  Medication Sig   aspirin EC 81 MG tablet Take 81 mg by mouth daily. Swallow whole.   atorvastatin (LIPITOR) 20 MG tablet Take 1 tablet (20 mg total) by mouth daily.   buPROPion (WELLBUTRIN XL) 300 MG 24 hr tablet Take 1 tablet (300 mg total) by mouth daily.   cetirizine (ZYRTEC) 10 MG tablet Take 1 tablet (10 mg total) by mouth daily.   FLUoxetine (PROZAC) 40 MG capsule Take 1 capsule (40 mg total) by mouth daily.   nebivolol (BYSTOLIC) 5 MG tablet Take 1 tablet (5 mg total) by mouth daily.   norethindrone-ethinyl estradiol (FYAVOLV) 1-5 MG-MCG TABS tablet Take 1 tablet by mouth  daily.   PREMPRO 0.625-2.5 MG tablet Take 1 tablet by mouth daily.   valsartan-hydrochlorothiazide (DIOVAN HCT) 160-12.5 MG tablet Take 1 tablet by mouth daily.   [DISCONTINUED] metoprolol succinate (TOPROL-XL) 50 MG 24 hr tablet Take 1 tablet (50 mg total) by mouth daily. Take with or immediately following a meal.   [DISCONTINUED] valsartan (DIOVAN) 160 MG tablet Take 1 tablet (160 mg total) by mouth daily.     Allergies:   Patient has no active allergies.   Social History   Socioeconomic History   Marital status: Divorced    Spouse name: Not on file   Number of children: Not on file   Years of education: Not on file   Highest education level: Not on file  Occupational History   Not on file  Tobacco Use   Smoking status: Every Day    Packs/day: 1    Types: Cigarettes   Smokeless tobacco: Current  Vaping Use   Vaping Use: Never used  Substance and Sexual Activity   Alcohol use: Yes    Comment: occ. wine   Drug use: No   Sexual activity: Not on file  Other Topics Concern   Not on file  Social History Narrative   Not on file   Social Determinants of Health   Financial Resource Strain: Not on file  Food Insecurity: Not on file  Transportation Needs: Not on file  Physical Activity: Not on file  Stress: Not on file  Social Connections: Not on file     Family History: The patient's family history includes COPD in her mother; Diabetes in her mother and sister; Heart disease in her father; Hypertension in her father, mother, sister, and sister; Kidney disease in her sister and sister; Stroke in her sister.  ROS:   ROS Please see the history of present illness.     All other systems reviewed and are negative.  EKGs/Labs/Other Studies Reviewed:    The following studies were reviewed today:   Chest CT lung cancer screening August 2023 showed coronary atherosclerosis LAD right coronary artery and thoracic aorta.   Renal vascular duplex 06/22/2022 with nonflow limiting  stenosis right renal artery.  EKG:  EKG is  ordered today.  The ekg ordered today is personally reviewed and demonstrates sinus rhythm and is normal.  Findings of LDH  Recent Labs: 03/28/2022: ALT 23; Hemoglobin 14.1; Platelets 215.0; TSH 2.39 06/24/2022: BUN 18; Creatinine, Ser 0.92; Potassium 4.3; Sodium 142  Recent Lipid Panel    Component Value Date/Time   CHOL 116 03/28/2022 0835   TRIG 95.0 03/28/2022 0835   HDL 45.20 03/28/2022 0835   CHOLHDL 3 03/28/2022 0835   VLDL 19.0 03/28/2022 0835   LDLCALC 52 03/28/2022 0835   LDLDIRECT 155.0 08/26/2019 1410    Physical Exam:  VS:  BP (!) 142/86 (BP Location: Right Arm, Patient Position: Sitting)   Pulse 65   Ht  (1.626 m)   Wt 193 lb 9.6 oz (87.8 kg)   SpO2 97%   BMI 33.23 kg/m     Wt Readings from Last 3 Encounters:  08/17/22 193 lb 9.6 oz (87.8 kg)  06/27/22 188 lb (85.3 kg)  06/24/22 186 lb 2 oz (84.4 kg)     GEN:  Well nourished, well developed in no acute distress HEENT: Normal NECK: No JVD; No carotid bruits LYMPHATICS: No lymphadenopathy CARDIAC: RRR, no murmurs, rubs, gallops RESPIRATORY:  Clear to auscultation without rales, wheezing or rhonchi  ABDOMEN: Soft, non-tender, non-distended MUSCULOSKELETAL:  No edema; No deformity  SKIN: Warm and dry NEUROLOGIC:  Alert and oriented x 3 PSYCHIATRIC:  Normal affect     Signed, Norman Herrlich, MD  08/17/2022 9:52 AM     [facility]

## 2022-08-17 ENCOUNTER — Ambulatory Visit: Payer: Commercial Managed Care - PPO | Attending: Cardiology | Admitting: Cardiology

## 2022-08-17 ENCOUNTER — Encounter: Payer: Self-pay | Admitting: Cardiology

## 2022-08-17 VITALS — BP 142/86 | HR 65 | Ht 64.0 in | Wt 193.6 lb

## 2022-08-17 DIAGNOSIS — I701 Atherosclerosis of renal artery: Secondary | ICD-10-CM

## 2022-08-17 DIAGNOSIS — I10 Essential (primary) hypertension: Secondary | ICD-10-CM | POA: Diagnosis not present

## 2022-08-17 DIAGNOSIS — F17210 Nicotine dependence, cigarettes, uncomplicated: Secondary | ICD-10-CM | POA: Diagnosis not present

## 2022-08-17 DIAGNOSIS — E782 Mixed hyperlipidemia: Secondary | ICD-10-CM | POA: Diagnosis not present

## 2022-08-17 MED ORDER — VALSARTAN-HYDROCHLOROTHIAZIDE 160-12.5 MG PO TABS: 1.0000 | ORAL_TABLET | Freq: Every day | ORAL | 3 refills | Status: DC

## 2022-08-17 MED ORDER — NEBIVOLOL HCL 5 MG PO TABS: 5.0000 mg | ORAL_TABLET | Freq: Every day | ORAL | 3 refills | Status: DC

## 2022-08-17 NOTE — Patient Instructions (Addendum)
Medication Instructions:  Your physician has recommended you make the following change in your medication:   STOP:Valsartan STOP: Metoprolol START: Valsartan/ Hydrchlorothiazide 160/12.5 mg daily START: Bystolic 5 mg daily   *If you need a refill on your cardiac medications before your next appointment, please call your pharmacy*   Lab Work: Your physician recommends that you return for lab work in:   Labs in 2 weeks: CMP, Lipids, LPa  If you have labs (blood work) drawn today and your tests are completely normal, you will receive your results only by: MyChart Message (if you have MyChart) OR A paper copy in the mail If you have any lab test that is abnormal or we need to change your treatment, we will call you to review the results.   Testing/Procedures: None   Follow-Up: At [facility], you and your health needs are our priority.  As part of our continuing mission to provide you with exceptional heart care, we have created designated Provider Care Teams.  These Care Teams include your primary Cardiologist (physician) and Advanced Practice Providers (APPs -  Physician Assistants and Nurse Practitioners) who all work together to provide you with the care you need, when you need it.  We recommend signing up for the patient portal called "MyChart".  Sign up information is provided on this After Visit Summary.  MyChart is used to connect with patients for Virtual Visits (Telemedicine).  Patients are able to view lab/test results, encounter notes, upcoming appointments, etc.  Non-urgent messages can be sent to your provider as well.   To learn more about what you can do with MyChart, go to [email or web address].    Your next appointment:   3 month(s)  Provider:   Norman Herrlich, MD    Other Instructions Check blood pressure daily for two weeks, then drop off the list of blood pressures to the office in 2 weeks.   [facility]  Tips to measure your blood pressure  correctly  To determine whether you have hypertension, a medical professional will take a blood pressure reading. How you prepare for the test, the position of your arm, and other factors can change a blood pressure reading by 10% or more. That could be enough to hide high blood pressure, start you on a drug you don't really need, or lead your doctor to incorrectly adjust your medications. National and international guidelines offer specific instructions for measuring blood pressure. If a doctor, nurse, or medical assistant isn't doing it right, don't hesitate to ask him or her to get with the guidelines. Here's what you can do to ensure a correct reading:  Don't drink a caffeinated beverage or smoke during the 30 minutes before the test.  Sit quietly for five minutes before the test begins.  During the measurement, sit in a chair with your feet on the floor and your arm supported so your elbow is at about heart level.  The inflatable part of the cuff should completely cover at least 80% of your upper arm, and the cuff should be placed on bare skin, not over a shirt.  Don't talk during the measurement.  Have your blood pressure measured twice, with a brief break in between. If the readings are different by 5 points or more, have it done a third time. There are times to break these rules. If you sometimes feel lightheaded when getting out of bed in the morning or when you stand after sitting, you should have your blood pressure checked while seated and  then while standing to see if it falls from one position to the next. Because blood pressure varies throughout the day, your doctor will rarely diagnose hypertension on the basis of a single reading. Instead, he or she will want to confirm the measurements on at least two occasions, usually within a few weeks of one another. The exception to this rule is if you have a blood pressure reading of 180/110 mm Hg or higher. A result this high usually calls for  prompt treatment. It's also a good idea to have your blood pressure measured in both arms at least once, since the reading in one arm (usually the right) may be higher than that in the left. A 2014 study in The American Journal of Medicine of nearly 3,400 people found average arm- to-arm differences in systolic blood pressure of about 5 points. The higher number should be used to make treatment decisions. In 2017, new guidelines from the American Heart Association, the [employer] of Cardiology, and nine other health organizations lowered the diagnosis of high blood pressure to 130/80 mm Hg or higher for all adults. The guidelines also redefined the various blood pressure categories to now include normal, elevated, Stage 1 hypertension, Stage 2 hypertension, and hypertensive crisis (see "Blood pressure categories"). Blood pressure categories  Blood pressure category SYSTOLIC (upper number)  DIASTOLIC (lower number)  Normal Less than 120 mm Hg and Less than 80 mm Hg  Elevated 120-129 mm Hg and Less than 80 mm Hg  High blood pressure: Stage 1 hypertension 130-139 mm Hg or 80-89 mm Hg  High blood pressure: Stage 2 hypertension 140 mm Hg or higher or 90 mm Hg or higher  Hypertensive crisis (consult your doctor immediately) Higher than 180 mm Hg and/or Higher than 120 mm Hg  Source: American Heart Association and American Stroke Association. For more on getting your blood pressure under control, buy Controlling Your Blood Pressure, a Special Health Report from [facility].   DASH diet: Healthy eating to lower your blood pressure The DASH diet emphasizes portion size, eating a variety of foods and getting the right amount of nutrients. Discover how DASH can improve your health and lower your blood pressure. By [facility] Staff  DASH stands for Dietary Approaches to Stop Hypertension. The DASH diet is a lifelong approach to healthy eating that's designed to help treat or prevent high  blood pressure (hypertension). The DASH diet encourages you to reduce the sodium in your diet and eat a variety of foods rich in nutrients that help lower blood pressure, such as potassium, calcium and magnesium. By following the DASH diet, you may be able to reduce your blood pressure by a few points in just two weeks. Over time, your systolic blood pressure could drop by eight to 14 points, which can make a significant difference in your health risks. Because the DASH diet is a healthy way of eating, it offers health benefits besides just lowering blood pressure. The DASH diet is also in line with dietary recommendations to prevent osteoporosis, cancer, heart disease, stroke and diabetes. DASH diet: Sodium levels The DASH diet emphasizes vegetables, fruits and low-fat dairy foods -- and moderate amounts of whole grains, fish, poultry and nuts. In addition to the standard DASH diet, there is also a lower sodium version of the diet. You can choose the version of the diet that meets your health needs: Standard DASH diet. You can consume up to 2,300 milligrams (mg) of sodium a day.  Lower  sodium DASH diet. You can consume up to 1,500 mg of sodium a day. Both versions of the DASH diet aim to reduce the amount of sodium in your diet compared with what you might get in a typical American diet, which can amount to a whopping 3,400 mg of sodium a day or more. The standard DASH diet meets the recommendation from the Dietary Guidelines for Americans to keep daily sodium intake to less than 2,300 mg a day. The American Heart Association recommends 1,500 mg a day of sodium as an upper limit for all adults. If you aren't sure what sodium level is right for you, talk to your doctor. DASH diet: What to eat Both versions of the DASH diet include lots of whole grains, fruits, vegetables and low-fat dairy products. The DASH diet also includes some fish, poultry and legumes, and encourages a small amount of nuts and seeds  a few times a week.  You can eat red meat, sweets and fats in small amounts. The DASH diet is low in saturated fat, cholesterol and total fat. Here's a look at the recommended servings from each food group for the 2,000-calorie-a-day DASH diet. Grains: 6 to 8 servings a day Grains include bread, cereal, rice and pasta. Examples of one serving of grains include 1 slice whole-wheat bread, 1 ounce dry cereal, or 1/2 cup cooked cereal, rice or pasta. Focus on whole grains because they have more fiber and nutrients than do refined grains. For instance, use brown rice instead of white rice, whole-wheat pasta instead of regular pasta and whole-grain bread instead of white bread. Look for products labeled "100 percent whole grain" or "100 percent whole wheat."  Grains are naturally low in fat. Keep them this way by avoiding butter, cream and cheese sauces. Vegetables: 4 to 5 servings a day Tomatoes, carrots, broccoli, sweet potatoes, greens and other vegetables are full of fiber, vitamins, and such minerals as potassium and magnesium. Examples of one serving include 1 cup raw leafy green vegetables or 1/2 cup cut-up raw or cooked vegetables. Don't think of vegetables only as side dishes -- a hearty blend of vegetables served over brown rice or whole-wheat noodles can serve as the main dish for a meal.  Fresh and frozen vegetables are both good choices. When buying frozen and canned vegetables, choose those labeled as low sodium or without added salt.  To increase the number of servings you fit in daily, be creative. In a stir-fry, for instance, cut the amount of meat in half and double up on the vegetables. Fruits: 4 to 5 servings a day Many fruits need little preparation to become a healthy part of a meal or snack. Like vegetables, they're packed with fiber, potassium and magnesium and are typically low in fat -- coconuts are an exception. Examples of one serving include one medium fruit, 1/2 cup fresh, frozen  or canned fruit, or 4 ounces of juice. Have a piece of fruit with meals and one as a snack, then round out your day with a dessert of fresh fruits topped with a dollop of low-fat yogurt.  Leave on edible peels whenever possible. The peels of apples, pears and most fruits with pits add interesting texture to recipes and contain healthy nutrients and fiber.  Remember that citrus fruits and juices, such as grapefruit, can interact with certain medications, so check with your doctor or pharmacist to see if they're OK for you.  If you choose canned fruit or juice, make sure no sugar  is added. Dairy: 2 to 3 servings a day Milk, yogurt, cheese and other dairy products are major sources of calcium, vitamin D and protein. But the key is to make sure that you choose dairy products that are low fat or fat-free because otherwise they can be a major source of fat -- and most of it is saturated. Examples of one serving include 1 cup skim or 1 percent milk, 1 cup low fat yogurt, or 1 1/2 ounces part-skim cheese. Low-fat or fat-free frozen yogurt can help you boost the amount of dairy products you eat while offering a sweet treat. Add fruit for a healthy twist.  If you have trouble digesting dairy products, choose lactose-free products or consider taking an over-the-counter product that contains the enzyme lactase, which can reduce or prevent the symptoms of lactose intolerance.  Go easy on regular and even fat-free cheeses because they are typically high in sodium. Lean meat, poultry and fish: 6 servings or fewer a day Meat can be a rich source of protein, B vitamins, iron and zinc. Choose lean varieties and aim for no more than 6 ounces a day. Cutting back on your meat portion will allow room for more vegetables. Trim away skin and fat from poultry and meat and then bake, broil, grill or roast instead of frying in fat.  Eat heart-healthy fish, such as salmon, herring and tuna. These types of fish are high in omega-3  fatty acids, which can help lower your total cholesterol. Nuts, seeds and legumes: 4 to 5 servings a week Almonds, sunflower seeds, kidney beans, peas, lentils and other foods in this family are good sources of magnesium, potassium and protein. They're also full of fiber and phytochemicals, which are plant compounds that may protect against some cancers and cardiovascular disease. Serving sizes are small and are intended to be consumed only a few times a week because these foods are high in calories. Examples of one serving include 1/3 cup nuts, 2 tablespoons seeds, or 1/2 cup cooked beans or peas.  Nuts sometimes get a bad rap because of their fat content, but they contain healthy types of fat -- monounsaturated fat and omega-3 fatty acids. They're high in calories, however, so eat them in moderation. Try adding them to stir-fries, salads or cereals.  Soybean-based products, such as tofu and tempeh, can be a good alternative to meat because they contain all of the amino acids your body needs to make a complete protein, just like meat. Fats and oils: 2 to 3 servings a day Fat helps your body absorb essential vitamins and helps your body's immune system. But too much fat increases your risk of heart disease, diabetes and obesity. The DASH diet strives for a healthy balance by limiting total fat to less than 30 percent of daily calories from fat, with a focus on the healthier monounsaturated fats. Examples of one serving include 1 teaspoon soft margarine, 1 tablespoon mayonnaise or 2 tablespoons salad dressing. Saturated fat and trans fat are the main dietary culprits in increasing your risk of coronary artery disease. DASH helps keep your daily saturated fat to less than 6 percent of your total calories by limiting use of meat, butter, cheese, whole milk, cream and eggs in your diet, along with foods made from lard, solid shortenings, and palm and coconut oils.  Avoid trans fat, commonly found in such  processed foods as crackers, baked goods and fried items.  Read food labels on margarine and salad dressing so that you  can choose those that are lowest in saturated fat and free of trans fat. Sweets: 5 servings or fewer a week You don't have to banish sweets entirely while following the DASH diet -- just go easy on them. Examples of one serving include 1 tablespoon sugar, jelly or jam, 1/2 cup sorbet, or 1 cup lemonade. When you eat sweets, choose those that are fat-free or low-fat, such as sorbets, fruit ices, jelly beans, hard candy, graham crackers or low-fat cookies.  Artificial sweeteners such as aspartame (NutraSweet, Equal) and sucralose (Splenda) may help satisfy your sweet tooth while sparing the sugar. But remember that you still must use them sensibly. It's OK to swap a diet cola for a regular cola, but not in place of a more nutritious beverage such as low-fat milk or even plain water.  Cut back on added sugar, which has no nutritional value but can pack on calories. DASH diet: Alcohol and caffeine Drinking too much alcohol can increase blood pressure. The Dietary Guidelines for Americans recommends that men limit alcohol to no more than two drinks a day and women to one or less. The DASH diet doesn't address caffeine consumption. The influence of caffeine on blood pressure remains unclear. But caffeine can cause your blood pressure to rise at least temporarily. If you already have high blood pressure or if you think caffeine is affecting your blood pressure, talk to your doctor about your caffeine consumption. DASH diet and weight loss While the DASH diet is not a weight-loss program, you may indeed lose unwanted pounds because it can help guide you toward healthier food choices. The DASH diet generally includes about 2,000 calories a day. If you're trying to lose weight, you may need to eat fewer calories. You may also need to adjust your serving goals based on your individual  circumstances -- something your health care team can help you decide. Tips to cut back on sodium The foods at the core of the DASH diet are naturally low in sodium. So just by following the DASH diet, you're likely to reduce your sodium intake. You also reduce sodium further by: Using sodium-free spices or flavorings with your food instead of salt  Not adding salt when cooking rice, pasta or hot cereal  Rinsing canned foods to remove some of the sodium  Buying foods labeled "no salt added," "sodium-free," "low sodium" or "very low sodium" One teaspoon of table salt has 2,325 mg of sodium. When you read food labels, you may be surprised at just how much sodium some processed foods contain. Even low-fat soups, canned vegetables, ready-to-eat cereals and sliced [place] from the local deli -- foods you may have considered healthy -- often have lots of sodium. You may notice a difference in taste when you choose low-sodium food and beverages. If things seem too bland, gradually introduce low-sodium foods and cut back on table salt until you reach your sodium goal. That'll give your palate time to adjust. Using salt-free seasoning blends or herbs and spices may also ease the transition. It can take several weeks for your taste buds to get used to less salty foods. Putting the pieces of the DASH diet together Try these strategies to get started on the DASH diet:  Change gradually. If you now eat only one or two servings of fruits or vegetables a day, try to add a serving at lunch and one at dinner. Rather than switching to all whole grains, start by making one or two of your grain servings  whole grains. Increasing fruits, vegetables and whole grains gradually can also help prevent bloating or diarrhea that may occur if you aren't used to eating a diet with lots of fiber. You can also try over-the-counter products to help reduce gas from beans and vegetables.  Reward successes and forgive slip-ups. Reward  yourself with a nonfood treat for your accomplishments -- rent a movie, purchase a book or get together with a friend. Everyone slips, especially when learning something new. Remember that changing your lifestyle is a long-term process. Find out what triggered your setback and then just pick up where you left off with the DASH diet.  Add physical activity. To boost your blood pressure lowering efforts even more, consider increasing your physical activity in addition to following the DASH diet. Combining both the DASH diet and physical activity makes it more likely that you'll reduce your blood pressure.  Get support if you need it. If you're having trouble sticking to your diet, talk to your doctor or dietitian about it. You might get some tips that will help you stick to the DASH diet. Remember, healthy eating isn't an all-or-nothing proposition. What's most important is that, on average, you eat healthier foods with plenty of variety -- both to keep your diet nutritious and to avoid boredom or extremes. And with the DASH diet, you can have both.

## 2022-08-31 ENCOUNTER — Other Ambulatory Visit: Payer: Self-pay | Admitting: Family Medicine

## 2022-10-11 ENCOUNTER — Other Ambulatory Visit: Payer: Self-pay | Admitting: Acute Care

## 2022-10-11 DIAGNOSIS — Z122 Encounter for screening for malignant neoplasm of respiratory organs: Secondary | ICD-10-CM

## 2022-10-11 DIAGNOSIS — F17210 Nicotine dependence, cigarettes, uncomplicated: Secondary | ICD-10-CM

## 2022-10-11 DIAGNOSIS — Z87891 Personal history of nicotine dependence: Secondary | ICD-10-CM

## 2022-10-19 ENCOUNTER — Other Ambulatory Visit: Payer: Self-pay | Admitting: Family Medicine

## 2022-11-01 ENCOUNTER — Encounter: Payer: Commercial Managed Care - PPO | Admitting: Family Medicine

## 2022-11-15 NOTE — Progress Notes (Unsigned)
Cardiology Office Note:    Date:  11/16/2022   ID:  Sara Hunter, DOB 25-Aug-1970, MRN [id number]  PCP:  Sheliah Hatch, MD  Cardiologist:  Norman Herrlich, MD    Referring MD: Sheliah Hatch, MD    ASSESSMENT:    1. Primary hypertension   2. Renal artery stenosis (HCC)   3. Mixed hyperlipidemia   4. Snores   5. Morbid obesity (HCC)    PLAN:    In order of problems listed above:  Well-controlled continue her current combination of vasodilator beta-blocker ARB thiazide diuretic well-tolerated effective and no side effects.  Recent labs show normal potassium kidney function Stable renal vascular duplex in February with nonflow limiting stenosis right renal artery Continue her statin Home sleep study With resistant hypertension and likely obstructive sleep apnea referred to supervised weight loss program although her BMI is shy of 35   Next appointment: 6 months   Medication Adjustments/Labs and Tests Ordered: Current medicines are reviewed at length with the patient today.  Concerns regarding medicines are outlined above.  No orders of the defined types were placed in this encounter.  No orders of the defined types were placed in this encounter.    History of Present Illness:    Sara Hunter is a 52 y.o. female with a hx of hypertension with mild renal artery stenosis hyper lipidemia coronary artery calcification on CT scan and ongoing cigarette smoking last seen 08/17/2022.  Compliance with diet, lifestyle and medications: Yes  On current medications her blood pressure is very well-controlled she runs in the range of 120/80-90 She checks her blood pressure on a regular basis validated device in good technique She works third shift she says that she has been told she snores loudly she has fatigue during the day and she is very frustrated attempting weight loss and with taking multiple antihypertensive agents I think she likely has obstructive sleep apnea  will do a home sleep study and I think with her comorbidities she may qualify for semaglutide and she request referral to supervised weight loss program She is not having cardiovascular symptoms of edema shortness of breath chest pain palpitation or syncope Past Medical History:  Diagnosis Date   Abscess of groin 11/11/2021   Anxiety and depression 01/19/2016   Family history of early CAD 05/11/2012   FRACTURE, COCCYX 07/28/2006   Qualifier: Diagnosis of   By: Blossom Hoops MD, Luis      Replacing diagnoses that were inactivated after the 07/25/22 regulatory import     Hot flashes 01/17/2017   HTN (hypertension) 08/26/2019   Hyperlipidemia 03/28/2022   Intention tremor 06/10/2022   Nevus 05/08/2014   Obesity (BMI 30-39.9) 08/26/2019   Post-nasal drip 05/11/2012   Routine general medical examination at a health care facility 08/02/2013   Tobacco use 05/11/2012   Vulval hidradenitis suppurativa 11/22/2012    Current Medications: Current Meds  Medication Sig   aspirin EC 81 MG tablet Take 81 mg by mouth daily. Swallow whole.   atorvastatin (LIPITOR) 20 MG tablet Take 1 tablet (20 mg total) by mouth daily.   buPROPion (WELLBUTRIN XL) 300 MG 24 hr tablet Take 1 tablet (300 mg total) by mouth daily.   cetirizine (ZYRTEC) 10 MG tablet Take 1 tablet (10 mg total) by mouth daily.   FLUoxetine (PROZAC) 40 MG capsule TAKE 1 CAPSULE (40 MG TOTAL) BY MOUTH DAILY.   nebivolol (BYSTOLIC) 5 MG tablet Take 1 tablet (5 mg total) by mouth daily.  PREMPRO 0.625-2.5 MG tablet Take 1 tablet by mouth daily.   valsartan-hydrochlorothiazide (DIOVAN HCT) 160-12.5 MG tablet Take 1 tablet by mouth daily.      EKGs/Labs/Other Studies Reviewed:    The following studies were reviewed today:          Recent Labs: 03/28/2022: ALT 23; Hemoglobin 14.1; Platelets 215.0; TSH 2.39 06/24/2022: BUN 18; Creatinine, Ser 0.92; Potassium 4.3; Sodium 142  Recent Lipid Panel    Component Value Date/Time   CHOL 116  03/28/2022 0835   TRIG 95.0 03/28/2022 0835   HDL 45.20 03/28/2022 0835   CHOLHDL 3 03/28/2022 0835   VLDL 19.0 03/28/2022 0835   LDLCALC 52 03/28/2022 0835   LDLDIRECT 155.0 08/26/2019 1410    Physical Exam:    VS:  BP 118/74   Pulse 60   Ht 5\' 4"  (1.626 m)   Wt 195 lb 6.4 oz (88.6 kg)   SpO2 95%   BMI 33.54 kg/m     Wt Readings from Last 3 Encounters:  11/16/22 195 lb 6.4 oz (88.6 kg)  08/17/22 193 lb 9.6 oz (87.8 kg)  06/27/22 188 lb (85.3 kg)     GEN:  Well nourished, well developed in no acute distress HEENT: Normal NECK: No JVD; No carotid bruits LYMPHATICS: No lymphadenopathy CARDIAC: RRR, no murmurs, rubs, gallops RESPIRATORY:  Clear to auscultation without rales, wheezing or rhonchi  ABDOMEN: Soft, non-tender, non-distended MUSCULOSKELETAL:  No edema; No deformity  SKIN: Warm and dry NEUROLOGIC:  Alert and oriented x 3 PSYCHIATRIC:  Normal affect    Signed, Norman Herrlich, MD  11/16/2022 9:12 AM    [facility]

## 2022-11-16 ENCOUNTER — Encounter: Payer: Self-pay | Admitting: Cardiology

## 2022-11-16 ENCOUNTER — Ambulatory Visit: Payer: Commercial Managed Care - PPO | Attending: Cardiology | Admitting: Cardiology

## 2022-11-16 VITALS — BP 118/74 | HR 60 | Ht 64.0 in | Wt 195.4 lb

## 2022-11-16 DIAGNOSIS — E782 Mixed hyperlipidemia: Secondary | ICD-10-CM

## 2022-11-16 DIAGNOSIS — I701 Atherosclerosis of renal artery: Secondary | ICD-10-CM

## 2022-11-16 DIAGNOSIS — I10 Essential (primary) hypertension: Secondary | ICD-10-CM

## 2022-11-16 DIAGNOSIS — R0683 Snoring: Secondary | ICD-10-CM | POA: Diagnosis not present

## 2022-11-16 NOTE — Patient Instructions (Signed)
Medication Instructions:  Your physician recommends that you continue on your current medications as directed. Please refer to the Current Medication list given to you today.  *If you need a refill on your cardiac medications before your next appointment, please call your pharmacy*   Lab Work: None If you have labs (blood work) drawn today and your tests are completely normal, you will receive your results only by: MyChart Message (if you have MyChart) OR A paper copy in the mail If you have any lab test that is abnormal or we need to change your treatment, we will call you to review the results.   Testing/Procedures: Itamar sleep study   Follow-Up: At [facility], you and your health needs are our priority.  As part of our continuing mission to provide you with exceptional heart care, we have created designated Provider Care Teams.  These Care Teams include your primary Cardiologist (physician) and Advanced Practice Providers (APPs -  Physician Assistants and Nurse Practitioners) who all work together to provide you with the care you need, when you need it.  We recommend signing up for the patient portal called "MyChart".  Sign up information is provided on this After Visit Summary.  MyChart is used to connect with patients for Virtual Visits (Telemedicine).  Patients are able to view lab/test results, encounter notes, upcoming appointments, etc.  Non-urgent messages can be sent to your provider as well.   To learn more about what you can do with MyChart, go to [email or web address].    Your next appointment:   6 month(s)  Provider:   Norman Herrlich, MD    Other Instructions Goal of 6500 steps per day

## 2022-11-24 ENCOUNTER — Encounter (INDEPENDENT_AMBULATORY_CARE_PROVIDER_SITE_OTHER): Payer: Self-pay

## 2022-12-13 ENCOUNTER — Inpatient Hospital Stay: Admission: RE | Admit: 2022-12-13 | Payer: Commercial Managed Care - PPO | Source: Ambulatory Visit

## 2022-12-15 ENCOUNTER — Encounter: Payer: Self-pay | Admitting: Nurse Practitioner

## 2022-12-15 ENCOUNTER — Ambulatory Visit (INDEPENDENT_AMBULATORY_CARE_PROVIDER_SITE_OTHER): Payer: Commercial Managed Care - PPO | Admitting: Nurse Practitioner

## 2022-12-15 VITALS — BP 127/85 | HR 67 | Temp 97.8°F | Ht 64.0 in | Wt 186.0 lb

## 2022-12-15 DIAGNOSIS — E669 Obesity, unspecified: Secondary | ICD-10-CM

## 2022-12-15 DIAGNOSIS — E785 Hyperlipidemia, unspecified: Secondary | ICD-10-CM

## 2022-12-15 DIAGNOSIS — I10 Essential (primary) hypertension: Secondary | ICD-10-CM

## 2022-12-15 DIAGNOSIS — Z6831 Body mass index (BMI) 31.0-31.9, adult: Secondary | ICD-10-CM

## 2022-12-15 DIAGNOSIS — Z0289 Encounter for other administrative examinations: Secondary | ICD-10-CM

## 2022-12-15 NOTE — Patient Instructions (Signed)

## 2022-12-15 NOTE — Progress Notes (Signed)
Office: [phone]  /  Fax: [phone]   Initial Visit  Sara Hunter was seen in clinic today to evaluate for obesity. She is interested in losing weight to improve overall health and reduce the risk of weight related complications. She presents today to review program treatment options, initial physical assessment, and evaluation.     She was referred by: cardiology  When asked what else they would like to accomplish? She states: Improve existing medical conditions, Reduce number of medications, Improve quality of life, and Lose a target amount of weight : Goal weight:  1450-150 lbs  Weight history:  She started gaining weight in 2021 after going through menopause.    When asked how has your weight affected you? She states: Having fatigue and Having poor endurance  Some associated conditions: HTN, HLD, anxiety, depression, tobacco use  Contributing factors: Family history, Stress, Life event, and Menopause  Weight promoting medications identified: Beta-blockers  Current nutrition plan: None  Current level of physical activity: None  Current or previous pharmacotherapy: None  Response to medication: Never tried medications   Past medical history includes:   Past Medical History:  Diagnosis Date   Abscess of groin 11/11/2021   Anxiety and depression 01/19/2016   Family history of early CAD 05/11/2012   FRACTURE, COCCYX 07/28/2006   Qualifier: Diagnosis of   By: Sara Hoops MD, Sara Hunter      Replacing diagnoses that were inactivated after the 07/25/22 regulatory import     Hot flashes 01/17/2017   HTN (hypertension) 08/26/2019   Hyperlipidemia 03/28/2022   Intention tremor 06/10/2022   Nevus 05/08/2014   Obesity (BMI 30-39.9) 08/26/2019   Post-nasal drip 05/11/2012   Routine general medical examination at a health care facility 08/02/2013   Tobacco use 05/11/2012   Vulval hidradenitis suppurativa 11/22/2012     Objective:   BP 127/85   Pulse 67   Temp 97.8 F  (36.6 C)   Ht 5\' 4"  (1.626 m)   Wt 186 lb (84.4 kg)   LMP  (LMP Unknown)   SpO2 97%   BMI 31.93 kg/m  She was weighed on the bioimpedance scale: Body mass index is 31.93 kg/m.  Peak Weight:230 lbs , Body Fat%:39.2, Visceral Fat Rating:10, Weight trend over the last 12 months: fluctuated  General:  Alert, oriented and cooperative. Patient is in no acute distress.  Respiratory: Normal respiratory effort, no problems with respiration noted   Gait: able to ambulate independently  Mental Status: Normal mood and affect. Normal behavior. Normal judgment and thought content.   DIAGNOSTIC DATA REVIEWED:  BMET    Component Value Date/Time   NA 142 06/24/2022 1345   K 4.3 06/24/2022 1345   CL 107 06/24/2022 1345   CO2 28 06/24/2022 1345   GLUCOSE 113 (H) 06/24/2022 1345   BUN 18 06/24/2022 1345   CREATININE 0.92 06/24/2022 1345   CALCIUM 9.6 06/24/2022 1345   Lab Results  Component Value Date   HGBA1C 6.1 03/29/2022   HGBA1C 5.6 05/24/2012   No results found for: "INSULIN" CBC    Component Value Date/Time   WBC 6.3 03/28/2022 0835   RBC 4.46 03/28/2022 0835   HGB 14.1 03/28/2022 0835   HCT 42.0 03/28/2022 0835   PLT 215.0 03/28/2022 0835   MCV 94.0 03/28/2022 0835   MCHC 33.7 03/28/2022 0835   RDW 13.4 03/28/2022 0835   Iron/TIBC/Ferritin/ %Sat No results found for: "IRON", "TIBC", "FERRITIN", "IRONPCTSAT" Lipid Panel     Component Value Date/Time  CHOL 116 03/28/2022 0835   TRIG 95.0 03/28/2022 0835   HDL 45.20 03/28/2022 0835   CHOLHDL 3 03/28/2022 0835   VLDL 19.0 03/28/2022 0835   LDLCALC 52 03/28/2022 0835   LDLDIRECT 155.0 08/26/2019 1410   Hepatic Function Panel     Component Value Date/Time   PROT 6.6 03/28/2022 0835   ALBUMIN 4.3 03/28/2022 0835   AST 16 03/28/2022 0835   ALT 23 03/28/2022 0835   ALKPHOS 60 03/28/2022 0835   BILITOT 0.6 03/28/2022 0835   BILIDIR 0.1 03/28/2022 0835      Component Value Date/Time   TSH 2.39 03/28/2022 0835      Assessment and Plan:   Hyperlipidemia, unspecified hyperlipidemia type Continue to follow up with PCP and cardiology.  Continue meds as directed  Primary hypertension Continue to follow up with PCP and cardiology.  Continue meds as directed  Generalized obesity  BMI 31.0-31.9,adult        Obesity Treatment / Action Plan:  Patient will work on garnering support from family and friends to begin weight loss journey. Will work on eliminating or reducing the presence of highly palatable, calorie dense foods in the home. Will complete provided nutritional and psychosocial assessment questionnaire before the next appointment. Will be scheduled for indirect calorimetry to determine resting energy expenditure in a fasting state.  This will allow [place] to create a reduced calorie, high-protein meal plan to promote loss of fat mass while preserving muscle mass. Counseled on the health benefits of losing 5%-15% of total body weight. Was counseled on nutritional approaches to weight loss and benefits of reducing processed foods and consuming plant-based foods and high quality protein as part of nutritional weight management. Was counseled on pharmacotherapy and role as an adjunct in weight management.   Obesity Education Performed Today:  She was weighed on the bioimpedance scale and results were discussed and documented in the synopsis.  We discussed obesity as a disease and the importance of a more detailed evaluation of all the factors contributing to the disease.  We discussed the importance of long term lifestyle changes which include nutrition, exercise and behavioral modifications as well as the importance of customizing this to her specific health and social needs.  We discussed the benefits of reaching a healthier weight to alleviate the symptoms of existing conditions and reduce the risks of the biomechanical, metabolic and psychological effects of obesity.  Sara Hunter  appears to be in the action stage of change and states they are ready to start intensive lifestyle modifications and behavioral modifications.  40 minutes was spent today on this visit including the above counseling, pre-visit chart review, and post-visit documentation.  Reviewed by clinician on day of visit: allergies, medications, problem list, medical history, surgical history, family history, social history, and previous encounter notes pertinent to obesity diagnosis.    Theodis Sato Donte Lenzo FNP-C

## 2022-12-26 NOTE — Progress Notes (Unsigned)
[facility] [place]  ASSESSMENT / PLAN: 52 y.o. female with mild unilateral right renal artery stenosis.  I counseled the patient extensively about renovascular hypertension.  I explained that I do not think unilateral stenosis can explain her hard-to-control blood pressure.  I will plan to reimage this in 6 months to ensure she is not rapidly progressing renal artery stenosis.  More worrisome to me is her early burden of atherosclerotic plaque.  I think she would benefit from referral to Dr. Rennis Golden for preventative evaluation.  She should continue best medical therapy for atherosclerotic disease:  Complete cessation from all tobacco products. Blood glucose control with goal A1c < 7%. Blood pressure control with goal blood pressure < 140/90 mmHg. Lipid reduction therapy with goal LDL-C <100 mg/dL (<91 if symptomatic from PAD).  Aspirin 81mg  PO QD.  Atorvastatin 40-80mg  PO QD (or other "high intensity" statin therapy).  I will see her again in 6 months with renal artery duplex and carotid duplex  CHIEF COMPLAINT: Right renal artery stenosis  HISTORY OF PRESENT ILLNESS: Sara Hunter is a 52 y.o. female referred to clinic for evaluation of right renal artery stenosis.  She has been under the care of Dr. Beverely Low for difficult to control high blood pressure.  Her blood pressure has been as high as 200 systolic.  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  She is with her friend and coworker today.  We reviewed the findings of her recent renal artery duplex.  I counseled her about renovascular hypertension.  We made a plan for surveillance together.  PMH: HTN  Past Surgical History:  Procedure Laterality Date   APPENDECTOMY     cyst remover on tailbone      Family History  Problem Relation Age of Onset   Hypertension Mother    Diabetes Mother    COPD Mother    Hypertension Father    Heart disease Father    Hypertension Sister    Diabetes  Sister    Kidney disease Sister    Hypertension Sister    Stroke Sister    Kidney disease Sister     Social History   Socioeconomic History   Marital status: Divorced    Spouse name: Not on file   Number of children: Not on file   Years of education: Not on file   Highest education level: Not on file  Occupational History   Not on file  Tobacco Use   Smoking status: Every Day    Current packs/day: 1.00    Types: Cigarettes   Smokeless tobacco: Current  Vaping Use   Vaping status: Never Used  Substance and Sexual Activity   Alcohol use: Yes    Comment: occ. wine   Drug use: No   Sexual activity: Not on file  Other Topics Concern   Not on file  Social History Narrative   Not on file   Social Determinants of Health   Financial Resource Strain: Not on file  Food Insecurity: Not on file  Transportation Needs: Not on file  Physical Activity: Not on file  Stress: Not on file  Social Connections: Not on file  Intimate Partner Violence: Not on file    No Known Allergies  Current Outpatient Medications  Medication Sig Dispense Refill   aspirin EC 81 MG tablet Take 81 mg by mouth daily. Swallow whole.     atorvastatin (LIPITOR) 20 MG tablet Take 1 tablet (20 mg total) by mouth  daily. 30 tablet 6   buPROPion (WELLBUTRIN XL) 300 MG 24 hr tablet Take 1 tablet (300 mg total) by mouth daily. 90 tablet 1   cetirizine (ZYRTEC) 10 MG tablet Take 1 tablet (10 mg total) by mouth daily. 90 tablet 3   FLUoxetine (PROZAC) 40 MG capsule TAKE 1 CAPSULE (40 MG TOTAL) BY MOUTH DAILY. 30 capsule 5   nebivolol (BYSTOLIC) 5 MG tablet Take 1 tablet (5 mg total) by mouth daily. 90 tablet 3   PREMPRO 0.625-2.5 MG tablet Take 1 tablet by mouth daily.     valsartan-hydrochlorothiazide (DIOVAN HCT) 160-12.5 MG tablet Take 1 tablet by mouth daily. 90 tablet 3   No current facility-administered medications for this visit.    PHYSICAL EXAM There were no vitals filed for this  visit.  Well-appearing woman in no acute distress Regular rate and rhythm Unlabored breathing 2+ radial pulses bilaterally 2+ DP pulses bilaterally   PERTINENT LABORATORY AND RADIOLOGIC DATA  Most recent CBC    Latest Ref Rng & Units 03/28/2022    8:35 AM 09/30/2021    8:21 AM 09/11/2020    1:13 PM  CBC  WBC 4.0 - 10.5 K/uL 6.3  5.7  11.2   Hemoglobin 12.0 - 15.0 g/dL 62.9  52.8  41.3   Hematocrit 36.0 - 46.0 % 42.0  44.3  42.4   Platelets 150.0 - 400.0 K/uL 215.0  177.0  221.0      Most recent CMP    Latest Ref Rng & Units 06/24/2022    1:45 PM 03/28/2022    8:35 AM 09/30/2021    8:21 AM  CMP  Glucose 70 - 99 mg/dL 244  010  272   BUN 6 - 23 mg/dL 18  10  13    Creatinine 0.40 - 1.20 mg/dL 5.36  6.44  0.34   Sodium 135 - 145 mEq/L 142  141  142   Potassium 3.5 - 5.1 mEq/L 4.3  3.3  3.9   Chloride 96 - 112 mEq/L 107  105  107   CO2 19 - 32 mEq/L 28  28  26    Calcium 8.4 - 10.5 mg/dL 9.6  9.0  9.5   Total Protein 6.0 - 8.3 g/dL  6.6  6.6   Total Bilirubin 0.2 - 1.2 mg/dL  0.6  0.9   Alkaline Phos 39 - 117 U/L  60  68   AST 0 - 37 U/L  16  27   ALT 0 - 35 U/L  23  46     Renal function CrCl cannot be calculated (Patient's most recent lab result is older than the maximum 21 days allowed.).  Hgb A1c MFr Bld (%)  Date Value  03/29/2022 6.1    LDL Cholesterol  Date Value Ref Range Status  03/28/2022 52 0 - 99 mg/dL Final   Direct LDL  Date Value Ref Range Status  08/26/2019 155.0 mg/dL Final    Comment:    Optimal:  <100 mg/dLNear or Above Optimal:  100-129 mg/dLBorderline High:  130-159 mg/dLHigh:  160-189 mg/dLVery High:  >190 mg/dL     Renal:    Right: 7-42% stenosis of the right renal artery. Proximal renal         artery PSV of 157 cm/s. Normal right Resisitive Index. Normal         size right kidney. RRV flow present.  Left:  No evidence of left renal artery stenosis. Normal left  Resistive Index. Normal size of left kidney. LRV flow          present.     On personal review of duplex, I feel this is on the milder end of the widely reported range.   Rande Brunt. Lenell Antu, MD FACS [facility] Phone Number: [phone] 12/26/2022 2:12 PM   Total time spent on preparing this encounter including chart review, data review, collecting history, examining the patient, coordinating care for this new patient, 60 minutes.  Portions of this report may have been transcribed using voice recognition software.  Every effort has been made to ensure accuracy; however, inadvertent computerized transcription errors may still be present.

## 2022-12-27 ENCOUNTER — Encounter: Payer: Self-pay | Admitting: Vascular Surgery

## 2022-12-27 ENCOUNTER — Ambulatory Visit: Payer: Commercial Managed Care - PPO | Admitting: Vascular Surgery

## 2022-12-27 ENCOUNTER — Ambulatory Visit (INDEPENDENT_AMBULATORY_CARE_PROVIDER_SITE_OTHER)
Admission: RE | Admit: 2022-12-27 | Discharge: 2022-12-27 | Disposition: A | Discharge status: Home / Self Care | Payer: Commercial Managed Care - PPO | Source: Ambulatory Visit | Attending: Vascular Surgery | Admitting: Vascular Surgery

## 2022-12-27 ENCOUNTER — Ambulatory Visit (HOSPITAL_COMMUNITY)
Admission: RE | Admit: 2022-12-27 | Discharge: 2022-12-27 | Disposition: A | Discharge status: Home / Self Care | Payer: Commercial Managed Care - PPO | Source: Ambulatory Visit | Attending: Vascular Surgery | Admitting: Vascular Surgery

## 2022-12-27 VITALS — BP 162/95 | HR 59 | Temp 98.0°F | Resp 20 | Ht 64.0 in | Wt 195.0 lb

## 2022-12-27 DIAGNOSIS — E785 Hyperlipidemia, unspecified: Secondary | ICD-10-CM

## 2022-12-27 DIAGNOSIS — Z8249 Family history of ischemic heart disease and other diseases of the circulatory system: Secondary | ICD-10-CM | POA: Diagnosis present

## 2022-12-27 DIAGNOSIS — I701 Atherosclerosis of renal artery: Secondary | ICD-10-CM | POA: Insufficient documentation

## 2023-01-02 ENCOUNTER — Ambulatory Visit: Payer: Commercial Managed Care - PPO | Admitting: Bariatrics

## 2023-01-02 ENCOUNTER — Encounter: Payer: Self-pay | Admitting: Bariatrics

## 2023-01-02 VITALS — BP 132/77 | HR 59 | Temp 97.5°F | Ht 64.0 in | Wt 189.0 lb

## 2023-01-02 DIAGNOSIS — R5383 Other fatigue: Secondary | ICD-10-CM | POA: Diagnosis not present

## 2023-01-02 DIAGNOSIS — I10 Essential (primary) hypertension: Secondary | ICD-10-CM

## 2023-01-02 DIAGNOSIS — R7309 Other abnormal glucose: Secondary | ICD-10-CM | POA: Diagnosis not present

## 2023-01-02 DIAGNOSIS — Z1331 Encounter for screening for depression: Secondary | ICD-10-CM | POA: Diagnosis not present

## 2023-01-02 DIAGNOSIS — E7849 Other hyperlipidemia: Secondary | ICD-10-CM | POA: Diagnosis not present

## 2023-01-02 DIAGNOSIS — R0602 Shortness of breath: Secondary | ICD-10-CM | POA: Diagnosis not present

## 2023-01-02 DIAGNOSIS — Z6832 Body mass index (BMI) 32.0-32.9, adult: Secondary | ICD-10-CM

## 2023-01-02 DIAGNOSIS — F17210 Nicotine dependence, cigarettes, uncomplicated: Secondary | ICD-10-CM

## 2023-01-02 DIAGNOSIS — E669 Obesity, unspecified: Secondary | ICD-10-CM

## 2023-01-02 DIAGNOSIS — E559 Vitamin D deficiency, unspecified: Secondary | ICD-10-CM | POA: Insufficient documentation

## 2023-01-02 DIAGNOSIS — E538 Deficiency of other specified B group vitamins: Secondary | ICD-10-CM | POA: Insufficient documentation

## 2023-01-03 ENCOUNTER — Other Ambulatory Visit: Payer: Self-pay | Admitting: Family Medicine

## 2023-01-03 LAB — CBC WITH DIFFERENTIAL/PLATELET
Basophils Absolute: 0.1 10*3/uL (ref 0.0–0.2)
Basos: 1 %
EOS (ABSOLUTE): 0.3 10*3/uL (ref 0.0–0.4)
Eos: 4 %
Hematocrit: 44.7 % (ref 34.0–46.6)
Hemoglobin: 14.8 g/dL (ref 11.1–15.9)
Immature Grans (Abs): 0.1 10*3/uL (ref 0.0–0.1)
Immature Granulocytes: 1 %
Lymphocytes Absolute: 2 10*3/uL (ref 0.7–3.1)
Lymphs: 26 %
MCH: 31.7 pg (ref 26.6–33.0)
MCHC: 33.1 g/dL (ref 31.5–35.7)
MCV: 96 fL (ref 79–97)
Monocytes Absolute: 0.5 10*3/uL (ref 0.1–0.9)
Monocytes: 6 %
Neutrophils Absolute: 4.6 10*3/uL (ref 1.4–7.0)
Neutrophils: 62 %
Platelets: 248 10*3/uL (ref 150–450)
RBC: 4.67 x10E6/uL (ref 3.77–5.28)
RDW: 12.2 % (ref 11.7–15.4)
WBC: 7.5 10*3/uL (ref 3.4–10.8)

## 2023-01-03 LAB — LIPID PANEL WITH LDL/HDL RATIO
Cholesterol, Total: 144 mg/dL (ref 100–199)
HDL: 53 mg/dL (ref >39)
LDL Chol Calc (NIH): 69 mg/dL (ref 0–99)
LDL/HDL Ratio: 1.3 ratio (ref 0.0–3.2)
Triglycerides: 122 mg/dL (ref 0–149)
VLDL Cholesterol Cal: 22 mg/dL (ref 5–40)

## 2023-01-03 LAB — COMPREHENSIVE METABOLIC PANEL
ALT: 27 IU/L (ref 0–32)
AST: 24 IU/L (ref 0–40)
Albumin: 4.8 g/dL (ref 3.8–4.9)
Alkaline Phosphatase: 70 IU/L (ref 44–121)
BUN/Creatinine Ratio: 20 (ref 9–23)
BUN: 18 mg/dL (ref 6–24)
Bilirubin Total: 0.9 mg/dL (ref 0.0–1.2)
CO2: 24 mmol/L (ref 20–29)
Calcium: 10.4 mg/dL — ABNORMAL HIGH (ref 8.7–10.2)
Chloride: 100 mmol/L (ref 96–106)
Creatinine, Ser: 0.88 mg/dL (ref 0.57–1.00)
Globulin, Total: 2.2 g/dL (ref 1.5–4.5)
Glucose: 112 mg/dL — ABNORMAL HIGH (ref 70–99)
Potassium: 4.3 mmol/L (ref 3.5–5.2)
Sodium: 141 mmol/L (ref 134–144)
Total Protein: 7 g/dL (ref 6.0–8.5)
eGFR: 79 mL/min/{1.73_m2} (ref >59)

## 2023-01-03 LAB — HEMOGLOBIN A1C
Est. average glucose Bld gHb Est-mCnc: 120 mg/dL
Hgb A1c MFr Bld: 5.8 % — ABNORMAL HIGH (ref 4.8–5.6)

## 2023-01-03 LAB — TSH+T4F+T3FREE
Free T4: 1.06 ng/dL (ref 0.82–1.77)
T3, Free: 3.3 pg/mL (ref 2.0–4.4)
TSH: 1.7 u[IU]/mL (ref 0.450–4.500)

## 2023-01-03 LAB — VITAMIN B12: Vitamin B-12: 314 pg/mL (ref 232–1245)

## 2023-01-03 LAB — INSULIN, RANDOM: INSULIN: 25.3 u[IU]/mL — ABNORMAL HIGH (ref 2.6–24.9)

## 2023-01-03 LAB — VITAMIN D 25 HYDROXY (VIT D DEFICIENCY, FRACTURES): Vit D, 25-Hydroxy: 39.1 ng/mL (ref 30.0–100.0)

## 2023-01-03 NOTE — Progress Notes (Unsigned)
Chief Complaint:   OBESITY Sara Hunter (MR# [id number]) is a 52 y.o. female who presents for evaluation and treatment of obesity and related comorbidities. Current BMI is Body mass index is 32.44 kg/m. Sara Hunter has been struggling with her weight for many years and has been unsuccessful in either losing weight, maintaining weight loss, or reaching her healthy weight goal.  Sara Hunter met with Sara Limbo, NP on 12/15/2022, for an informational session.  She does like to cook, she has struggled with her weight for the last few years.  Sara Hunter is currently in the action stage of change and ready to dedicate time achieving and maintaining a healthier weight. Sara Hunter is interested in becoming our patient and working on intensive lifestyle modifications including (but not limited to) diet and exercise for weight loss.  Sara Hunter's habits were reviewed today and are as follows: Her family eats meals together, she thinks her family will eat healthier with her, she started gaining weight about 5 years ago, her heaviest weight ever was 195 pounds, she is a picky eater and doesn't like to eat healthier foods, she has significant food cravings issues, she snacks frequently in the evenings, she skips meals frequently, she is frequently drinking liquids with calories, she frequently makes poor food choices, and she struggles with emotional eating.  Depression Screen Sara Hunter's Food and Mood (modified PHQ-9) score was 9.  Subjective:   1. Other fatigue Sara Hunter admits to daytime somnolence and admits to waking up still tired. Patient has a history of symptoms of daytime fatigue and morning fatigue. Sara Hunter generally gets 8 hours of sleep per night, and states that she has nightime awakenings. Snoring is present. Apneic episodes are present. Epworth Sleepiness Score is 9.  Discussed the importance of her RMR for diet and exercise  2. SOB (shortness of breath) on exertion Sara Hunter notes increasing shortness of  breath with exercising and seems to be worsening over time with weight gain. She notes getting out of breath sooner with activity than she used to. This has not gotten worse recently. Sara Hunter denies shortness of breath at rest or orthopnea.  3. Other hyperlipidemia Patient is taking Lipitor.  4. Essential hypertension Patient is taking Bystolic and valsartan and and continues to be reasonably well-controlled.  5. Elevated hemoglobin A1c Patient was last A1c was 6.1.  6. Vitamin D deficiency Patient is not taking any vitamin D supplements.  7. B12 deficiency Patient is not taking any vitamin B supplements.  Assessment/Plan:   1. Other fatigue Sara Hunter does feel that her weight is causing her energy to be lower than it should be. Fatigue may be related to obesity, depression or many other causes. Labs will be ordered, and in the meanwhile, Sara Hunter will focus on self care including making healthy food choices, increasing physical activity and focusing on stress reduction.  - Comprehensive metabolic panel - CBC with Differential/Platelet - TSH+T4F+T3Free  2. SOB (shortness of breath) on exertion Sara Hunter does feel that she gets out of breath more easily that she used to when she exercises. Sara Hunter's shortness of breath appears to be obesity related and exercise induced. She has agreed to work on weight loss and gradually increase exercise to treat her exercise induced shortness of breath. Will continue to monitor closely.  - Comprehensive metabolic panel - [id number]  3. Other hyperlipidemia Continue Lipitor.  Check labs today.  - Lipid Panel With LDL/HDL Ratio  4. Essential hypertension Continue medications.  Check labs today.  - Comprehensive metabolic panel  5. Elevated hemoglobin A1c Check labs today.  - Hemoglobin A1c - Insulin, random  6. Vitamin D deficiency Check labs today.  - VITAMIN D 25 Hydroxy (Vit-D Deficiency, Fractures) - CBC with  Differential/Platelet  7. B12 deficiency Check labs today.  - CBC with Differential/Platelet - Vitamin B12  8. Depression screening Sara Hunter had a positive depression screening. Depression is commonly associated with obesity and often results in emotional eating behaviors. We will monitor this closely and work on CBT to help improve the non-hunger eating patterns. Referral to Psychology may be required if no improvement is seen as she continues in our clinic.  9. Generalized obesity Sara Hunter is currently in the action stage of change and her goal is to continue with weight loss efforts. I recommend Sara Hunter begin the structured treatment plan as follows:  1.  Meal planning. 2.  Labs 06/24/2022.  BMP, glucose. 03/29/2022, A1c 6.1. 3.  Will cut back on iced tea. 4.  GLP-1 sheet.  She has agreed to the Category 3 Plan.  Exercise goals: No exercise has been prescribed at this time.   Behavioral modification strategies: increasing lean protein intake, decreasing simple carbohydrates, increasing vegetables, increasing water intake, decreasing eating out, no skipping meals, meal planning and cooking strategies, keeping healthy foods in the home, and planning for success.  She was informed of the importance of frequent follow-up visits to maximize her success with intensive lifestyle modifications for her multiple health conditions. She was informed we would discuss her lab results at her next visit unless there is a critical issue that needs to be addressed sooner. Sara Hunter agreed to keep her next visit at the agreed upon time to discuss these results.  Objective:   Blood pressure 132/77, pulse (!) 59, temperature (!) 97.5 F (36.4 C), height 5\' 4"  (1.626 m), weight 189 lb (85.7 kg), SpO2 94%. Body mass index is 32.44 kg/m.  EKG: Normal sinus rhythm, rate 65 BPM.  Indirect Calorimeter completed today shows a VO2 of 286 and a REE of 1973.  Her calculated basal metabolic rate is 9518 thus her basal  metabolic rate is better than expected.  General: Cooperative, alert, well developed, in no acute distress. HEENT: Conjunctivae and lids unremarkable. Cardiovascular: Regular rhythm.  Lungs: Normal work of breathing. Neurologic: No focal deficits.   Lab Results  Component Value Date   CREATININE 0.88 01/02/2023   BUN 18 01/02/2023   NA 141 01/02/2023   K 4.3 01/02/2023   CL 100 01/02/2023   CO2 24 01/02/2023   Lab Results  Component Value Date   ALT 27 01/02/2023   AST 24 01/02/2023   ALKPHOS 70 01/02/2023   BILITOT 0.9 01/02/2023   Lab Results  Component Value Date   HGBA1C 5.8 (H) 01/02/2023   HGBA1C 6.1 03/29/2022   HGBA1C 5.6 05/24/2012   Lab Results  Component Value Date   INSULIN 25.3 (H) 01/02/2023   Lab Results  Component Value Date   TSH 1.700 01/02/2023   Lab Results  Component Value Date   CHOL 144 01/02/2023   HDL 53 01/02/2023   LDLCALC 69 01/02/2023   LDLDIRECT 155.0 08/26/2019   TRIG 122 01/02/2023   CHOLHDL 3 03/28/2022   Lab Results  Component Value Date   WBC 7.5 01/02/2023   HGB 14.8 01/02/2023   HCT 44.7 01/02/2023   MCV 96 01/02/2023   PLT 248 01/02/2023   No results found for: "IRON", "TIBC", "FERRITIN"  Attestation Statements:   Reviewed by clinician on day  of visit: allergies, medications, problem list, medical history, surgical history, family history, social history, and previous encounter notes.  Silvana Newness, am acting as Energy manager for [employer], DO.  I have reviewed the above documentation for accuracy and completeness, and I agree with the above. Corinna Capra, DO

## 2023-01-05 ENCOUNTER — Encounter: Payer: Self-pay | Admitting: Bariatrics

## 2023-01-05 DIAGNOSIS — E88819 Insulin resistance, unspecified: Secondary | ICD-10-CM | POA: Insufficient documentation

## 2023-01-05 DIAGNOSIS — R7303 Prediabetes: Secondary | ICD-10-CM | POA: Insufficient documentation

## 2023-01-16 ENCOUNTER — Ambulatory Visit (INDEPENDENT_AMBULATORY_CARE_PROVIDER_SITE_OTHER): Payer: Commercial Managed Care - PPO | Admitting: Family Medicine

## 2023-01-16 ENCOUNTER — Encounter: Payer: Self-pay | Admitting: Family Medicine

## 2023-01-16 VITALS — BP 115/75 | HR 60 | Temp 97.8°F | Ht 64.0 in | Wt 191.0 lb

## 2023-01-16 DIAGNOSIS — R7303 Prediabetes: Secondary | ICD-10-CM

## 2023-01-16 DIAGNOSIS — T50905A Adverse effect of unspecified drugs, medicaments and biological substances, initial encounter: Secondary | ICD-10-CM | POA: Diagnosis not present

## 2023-01-16 DIAGNOSIS — R632 Polyphagia: Secondary | ICD-10-CM | POA: Diagnosis not present

## 2023-01-16 DIAGNOSIS — E669 Obesity, unspecified: Secondary | ICD-10-CM

## 2023-01-16 DIAGNOSIS — E88819 Insulin resistance, unspecified: Secondary | ICD-10-CM

## 2023-01-16 DIAGNOSIS — Z6832 Body mass index (BMI) 32.0-32.9, adult: Secondary | ICD-10-CM

## 2023-01-16 MED ORDER — NALTREXONE HCL 50 MG PO TABS: 25.0000 mg | ORAL_TABLET | Freq: Every day | ORAL | 0 refills | Status: AC

## 2023-01-16 NOTE — Assessment & Plan Note (Signed)
Lab Results  Component Value Date   HGBA1C 5.8 (H) 01/02/2023   Reviewed lab results with patient.  She is in the prediabetic range.  She reports a positive family history for type 2 diabetes including her sister.  She has recently reduced her high intake of sweet tea.  She is actively working on weight loss.  She has not yet added in regular exercise.  She has never taken metformin.  Continue working on dietary change, reducing intake of sugar sweetened beverages to 0.  Increase walking time with a goal of 30 minutes 5 days a week.

## 2023-01-16 NOTE — Assessment & Plan Note (Signed)
Reviewed lab results with patient.  Her fasting insulin was elevated at 25.3 and she has symptoms of hyperinsulinemia including sugar cravings and hyperphagia.  She has been working on reducing her high intake of sweet tea.  We discussed changing out some of her refined carbohydrate, high sugar snacks for things that are higher in fiber and higher in lean protein.  A snack list was provided at her last visit. Work on increasing both cardio and resistance training for insulin sensitivity.

## 2023-01-16 NOTE — Assessment & Plan Note (Signed)
Reviewed lab results with patient.  She has a mild hypercalcemia with a calcium level of 10.4 likely due to HCTZ use. She is asymptomatic.  Follow-up with PCP.  Continue to monitor.

## 2023-01-16 NOTE — Assessment & Plan Note (Signed)
Polyphasia worsen by dietary change and reducing high intake of sugar sweetened beverages.  This is likely worsened by her hyperinsulinemia.  She is also skipping breakfast in the morning with inadequate protein intake triggering increased hunger.  We discussed a plan to get protein with all 3 meals.  She may substitute a protein shake with less than 7 g of sugar for breakfast and 20 to 30 g of protein.  Increase intake of nonstarchy vegetables and allow 1-2 fruit servings per day.  Increase water intake.  Continue reducing sugar sweetened beverages.  Begin naltrexone 25 mg every afternoon with dinner

## 2023-01-16 NOTE — Progress Notes (Signed)
Office: [phone]  /  Fax: [phone]  WEIGHT SUMMARY AND BIOMETRICS  Starting Date: 01/02/23  Starting Weight: 189lb   Weight Lost Since Last Visit: 0lb   Vitals Temp: 97.8 F (36.6 C) BP: 115/75 Pulse Rate: 60 SpO2: 95 %   Body Composition  Body Fat %: 41.2 % Fat Mass (lbs): 78.8 lbs Muscle Mass (lbs): 106.8 lbs Total Body Water (lbs): 76.8 lbs Visceral Fat Rating : 10    HPI  Chief Complaint: OBESITY  Sara Hunter is here to discuss her progress with her obesity treatment plan. She is on the the Category 3 Plan and states she is following her eating plan approximately 75 % of the time. She states she is walking for  30 minutes 3 times per week.   Interval History:  Since last office visit she is up 2 lb She has been cutting back on sweet tea intake She is getting in most of her meals on her plan She has been eating Healthy Choice meals for lunches She works 3rd shift on the weekends She tends to snack more when working  She craves sweets more at work She is trying to get in more water She is picking grapes, pineapple, apples for fruit She is skipping breakfast  Pharmacotherapy: none  PHYSICAL EXAM:  Blood pressure 115/75, pulse 60, temperature 97.8 F (36.6 C), height 5\' 4"  (1.626 m), weight 191 lb (86.6 kg), SpO2 95%. Body mass index is 32.79 kg/m.  General: She is overweight, cooperative, alert, well developed, and in no acute distress. PSYCH: Has normal mood, affect and thought process.   Lungs: Normal breathing effort, no conversational dyspnea.   ASSESSMENT AND PLAN  TREATMENT PLAN FOR OBESITY:  Recommended Dietary Goals  Sara Hunter is currently in the action stage of change. As such, her goal is to continue weight management plan. She has agreed to the Category 3 Plan. - do not skip breakfast.  At minimum, have a 30 g protein shake in the mornings  Behavioral Intervention  We discussed the following Behavioral Modification Strategies  today: increasing lean protein intake, decreasing simple carbohydrates , increasing vegetables, increasing lower glycemic fruits, increasing fiber rich foods, avoiding skipping meals, increasing water intake, work on meal planning and preparation, identifying sources and decreasing liquid calories, continue to practice mindfulness when eating, and planning for success.  Additional resources provided today: NA  Recommended Physical Activity Goals  Sara Hunter has been advised to work up to 150 minutes of moderate intensity aerobic activity a week and strengthening exercises 2-3 times per week for cardiovascular health, weight loss maintenance and preservation of muscle mass.   She has agreed to Increase physical activity in their day and reduce sedentary time (increase NEAT). - tracks steps, check out the gym 1 x a week  Pharmacotherapy changes for the treatment of obesity: begin Naltrexone 25 mg with dinner daily  ASSOCIATED CONDITIONS ADDRESSED TODAY  Prediabetes Assessment & Plan: Lab Results  Component Value Date   HGBA1C 5.8 (H) 01/02/2023   Reviewed lab results with patient.  She is in the prediabetic range.  She reports a positive family history for type 2 diabetes including her sister.  She has recently reduced her high intake of sweet tea.  She is actively working on weight loss.  She has not yet added in regular exercise.  She has never taken metformin.  Continue working on dietary change, reducing intake of sugar sweetened beverages to 0.  Increase walking time with a goal of 30 minutes 5  days a week.   Polyphagia Assessment & Plan: Polyphasia worsen by dietary change and reducing high intake of sugar sweetened beverages.  This is likely worsened by her hyperinsulinemia.  She is also skipping breakfast in the morning with inadequate protein intake triggering increased hunger.  We discussed a plan to get protein with all 3 meals.  She may substitute a protein shake with less than 7  g of sugar for breakfast and 20 to 30 g of protein.  Increase intake of nonstarchy vegetables and allow 1-2 fruit servings per day.  Increase water intake.  Continue reducing sugar sweetened beverages.  Begin naltrexone 25 mg every afternoon with dinner  Orders: -     Naltrexone HCl; Take 0.5 tablets (25 mg total) by mouth daily with supper.  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  She has a mild hypercalcemia with a calcium level of 10.4 likely due to HCTZ use. She is asymptomatic.  Follow-up with PCP.  Continue to monitor.   Insulin resistance Assessment & Plan: Reviewed lab results with patient.  Her fasting insulin was elevated at 25.3 and she has symptoms of hyperinsulinemia including sugar cravings and hyperphagia.  She has been working on reducing her high intake of sweet tea.  We discussed changing out some of her refined carbohydrate, high sugar snacks for things that are higher in fiber and higher in lean protein.  A snack list was provided at her last visit. Work on increasing both cardio and resistance training for insulin sensitivity.       She was informed of the importance of frequent follow up visits to maximize her success with intensive lifestyle modifications for her multiple health conditions.   ATTESTASTION STATEMENTS:  Reviewed by clinician on day of visit: allergies, medications, problem list, medical history, surgical history, family history, social history, and previous encounter notes pertinent to obesity diagnosis.   I have personally spent 30 minutes total time today in preparation, patient care, nutritional counseling and documentation for this visit, including the following: review of clinical lab tests; review of medical tests/procedures/services.      Sara Brink, DO DABFM, DABOM [facility] [address] [place], [place] [address] [phone]

## 2023-02-06 ENCOUNTER — Ambulatory Visit: Payer: Commercial Managed Care - PPO | Admitting: Bariatrics

## 2023-02-14 DIAGNOSIS — K219 Gastro-esophageal reflux disease without esophagitis: Secondary | ICD-10-CM | POA: Insufficient documentation

## 2023-02-14 DIAGNOSIS — R490 Dysphonia: Secondary | ICD-10-CM | POA: Insufficient documentation

## 2023-04-20 ENCOUNTER — Other Ambulatory Visit: Payer: Self-pay | Admitting: Family Medicine

## 2023-04-22 ENCOUNTER — Other Ambulatory Visit: Payer: Self-pay | Admitting: Family Medicine

## 2023-04-28 ENCOUNTER — Ambulatory Visit (INDEPENDENT_AMBULATORY_CARE_PROVIDER_SITE_OTHER): Payer: Commercial Managed Care - PPO | Admitting: Family Medicine

## 2023-04-28 ENCOUNTER — Encounter: Payer: Self-pay | Admitting: Family Medicine

## 2023-04-28 VITALS — BP 128/54 | HR 78 | Temp 98.0°F | Ht 64.0 in | Wt 197.5 lb

## 2023-04-28 DIAGNOSIS — J329 Chronic sinusitis, unspecified: Secondary | ICD-10-CM | POA: Diagnosis not present

## 2023-04-28 DIAGNOSIS — R051 Acute cough: Secondary | ICD-10-CM

## 2023-04-28 DIAGNOSIS — B9689 Other specified bacterial agents as the cause of diseases classified elsewhere: Secondary | ICD-10-CM | POA: Diagnosis not present

## 2023-04-28 LAB — POC INFLUENZA A&B (BINAX/QUICKVUE)
Influenza A, POC: NEGATIVE
Influenza B, POC: NEGATIVE

## 2023-04-28 LAB — POC COVID19 BINAXNOW: SARS Coronavirus 2 Ag: NEGATIVE

## 2023-04-28 MED ORDER — AMOXICILLIN 875 MG PO TABS: 875.0000 mg | ORAL_TABLET | Freq: Two times a day (BID) | ORAL | 0 refills | Status: AC

## 2023-04-28 NOTE — Patient Instructions (Signed)
 Follow up as needed or as scheduled START the Amoxicillin twice daily as directed Drink LOTS of fluids! Mucinex DM for cough and congestion REST! Call with any questions or concerns Hang in there!!

## 2023-04-28 NOTE — Progress Notes (Signed)
   Subjective:    Patient ID: Sara Hunter, female    DOB: Jul 24, 1970, 53 y.o.   MRN: [id number]  HPI URI- sxs started 2 days ago w/ congestion, R ear pain, cough.  + body aches.  No fever.  + facial pain/pressure.  + upper molar pain.  + sick contacts.  No N/V.     Review of Systems For ROS see HPI     Objective:   Physical Exam Vitals reviewed.  Constitutional:      General: She is not in acute distress.    Appearance: She is well-developed. She is not ill-appearing.  HENT:     Head: Normocephalic and atraumatic.     Right Ear: Tympanic membrane normal.     Left Ear: Tympanic membrane normal.     Nose: Mucosal edema, congestion and rhinorrhea present.     Right Sinus: Maxillary sinus tenderness and frontal sinus tenderness present.     Left Sinus: Maxillary sinus tenderness and frontal sinus tenderness present.     Mouth/Throat:     Pharynx: Uvula midline. Posterior oropharyngeal erythema present. No oropharyngeal exudate.  Eyes:     Conjunctiva/sclera: Conjunctivae normal.     Pupils: Pupils are equal, round, and reactive to light.  Cardiovascular:     Rate and Rhythm: Normal rate and regular rhythm.     Heart sounds: Normal heart sounds.  Pulmonary:     Effort: Pulmonary effort is normal. No respiratory distress.     Breath sounds: Normal breath sounds. No wheezing.     Comments: + hacking cough Musculoskeletal:     Cervical back: Normal range of motion and neck supple.  Lymphadenopathy:     Cervical: No cervical adenopathy.  Skin:    General: Skin is warm and dry.  Neurological:     General: No focal deficit present.     Mental Status: She is alert and oriented to person, place, and time.     Cranial Nerves: No cranial nerve deficit.     Motor: No weakness.     Coordination: Coordination normal.  Psychiatric:        Mood and Affect: Mood normal.        Behavior: Behavior normal.        Thought Content: Thought content normal.           Assessment &  Plan:  Bacterial sinusitis- new.  Flu and COVID were negative but she likely contracted a viral illness from her daughter who is home from college and then in the last few days has developed HA, facial pain, tooth pain consistent w/ sinus infxn.  Start amoxicillin  875mg  BID.  Note provided for work.  Reviewed supportive care and red flags that should prompt return.  Pt expressed understanding and is in agreement w/ plan.

## 2023-05-25 ENCOUNTER — Other Ambulatory Visit: Payer: Self-pay | Admitting: Family Medicine

## 2023-05-30 ENCOUNTER — Encounter: Payer: Self-pay | Admitting: Acute Care

## 2023-06-20 ENCOUNTER — Other Ambulatory Visit: Payer: Self-pay

## 2023-06-20 MED ORDER — FLUOXETINE HCL 40 MG PO CAPS: 40.0000 mg | ORAL_CAPSULE | Freq: Every day | ORAL | 4 refills | Status: DC

## 2023-06-20 MED ORDER — NEBIVOLOL HCL 5 MG PO TABS: 5.0000 mg | ORAL_TABLET | Freq: Every day | ORAL | 0 refills | Status: DC

## 2023-06-20 MED ORDER — VALSARTAN-HYDROCHLOROTHIAZIDE 160-12.5 MG PO TABS: 1.0000 | ORAL_TABLET | Freq: Every day | ORAL | 0 refills | Status: DC

## 2023-06-21 ENCOUNTER — Other Ambulatory Visit: Payer: Self-pay | Admitting: Obstetrics and Gynecology

## 2023-06-21 DIAGNOSIS — R928 Other abnormal and inconclusive findings on diagnostic imaging of breast: Secondary | ICD-10-CM

## 2023-06-30 ENCOUNTER — Ambulatory Visit
Admission: RE | Admit: 2023-06-30 | Discharge: 2023-06-30 | Disposition: A | Discharge status: Home / Self Care | Source: Ambulatory Visit | Attending: Obstetrics and Gynecology | Admitting: Obstetrics and Gynecology

## 2023-06-30 ENCOUNTER — Ambulatory Visit
Admission: RE | Admit: 2023-06-30 | Discharge: 2023-06-30 | Disposition: A | Discharge status: Home / Self Care | Payer: Commercial Managed Care - PPO | Source: Ambulatory Visit | Attending: Obstetrics and Gynecology | Admitting: Obstetrics and Gynecology

## 2023-06-30 ENCOUNTER — Other Ambulatory Visit: Payer: Self-pay | Admitting: Obstetrics and Gynecology

## 2023-06-30 ENCOUNTER — Other Ambulatory Visit: Payer: Self-pay | Admitting: Cardiology

## 2023-06-30 DIAGNOSIS — R928 Other abnormal and inconclusive findings on diagnostic imaging of breast: Secondary | ICD-10-CM

## 2023-06-30 DIAGNOSIS — N6310 Unspecified lump in the right breast, unspecified quadrant: Secondary | ICD-10-CM

## 2023-06-30 NOTE — Telephone Encounter (Signed)
 Medication denied because it was sent on 05/2023, see below:   nebivolol (BYSTOLIC) 5 MG tablet Medication Date: 06/20/2023 Department: [facility] Ordering/Authorizing: Baldo Daub, MD   Order Providers  Prescribing Provider Encounter Provider  Baldo Daub, MD Dione Housekeeper, CMA   Outpatient Medication Detail   Disp Refills Start End   nebivolol (BYSTOLIC) 5 MG tablet 90 tablet 0 06/20/2023 --   Sig - Route: Take 1 tablet (5 mg total) by mouth daily. - Oral   Sent to pharmacy as: nebivolol (BYSTOLIC) 5 MG tablet   E-Prescribing Status: Receipt confirmed by pharmacy (06/20/2023  3:13 PM EST)      valsartan-hydrochlorothiazide (DIOVAN HCT) 160-12.5 MG tablet Medication Date: 06/20/2023 Department: [facility] Ordering/Authorizing: Baldo Daub, MD   Order Providers  Prescribing Provider Encounter Provider  Baldo Daub, MD Dione Housekeeper, CMA   Outpatient Medication Detail   Disp Refills Start End   valsartan-hydrochlorothiazide (DIOVAN HCT) 160-12.5 MG tablet 90 tablet 0 06/20/2023 --   Sig - Route: Take 1 tablet by mouth daily. - Oral   Sent to pharmacy as: valsartan-hydrochlorothiazide (DIOVAN HCT) 160-12.5 MG tablet   E-Prescribing Status: Receipt confirmed by pharmacy (06/20/2023  3:13 PM EST)    Pharmacy  [facility] - [place], [place] - [address]

## 2023-07-06 ENCOUNTER — Other Ambulatory Visit: Payer: Self-pay

## 2023-07-06 MED ORDER — FLUOXETINE HCL 40 MG PO CAPS: 40.0000 mg | ORAL_CAPSULE | Freq: Every day | ORAL | 4 refills | Status: DC

## 2023-07-11 ENCOUNTER — Other Ambulatory Visit: Payer: Self-pay

## 2023-07-11 ENCOUNTER — Other Ambulatory Visit: Payer: Self-pay | Admitting: Cardiology

## 2023-07-11 DIAGNOSIS — I10 Essential (primary) hypertension: Secondary | ICD-10-CM

## 2023-07-11 DIAGNOSIS — Z1331 Encounter for screening for depression: Secondary | ICD-10-CM

## 2023-07-11 MED ORDER — ATORVASTATIN CALCIUM 20 MG PO TABS: 20.0000 mg | ORAL_TABLET | Freq: Every day | ORAL | 6 refills | Status: DC

## 2023-07-11 MED ORDER — FLUOXETINE HCL 40 MG PO CAPS: 40.0000 mg | ORAL_CAPSULE | Freq: Every day | ORAL | 4 refills | Status: DC

## 2023-07-11 MED ORDER — BUPROPION HCL ER (XL) 300 MG PO TB24: 300.0000 mg | ORAL_TABLET | Freq: Every day | ORAL | 4 refills | Status: DC

## 2023-07-12 ENCOUNTER — Other Ambulatory Visit: Payer: Self-pay | Admitting: Cardiology

## 2023-07-12 ENCOUNTER — Encounter: Payer: Self-pay | Admitting: Family Medicine

## 2023-07-12 MED ORDER — NEBIVOLOL HCL 5 MG PO TABS: 5.0000 mg | ORAL_TABLET | Freq: Every day | ORAL | 0 refills | Status: DC

## 2023-07-12 MED ORDER — VALSARTAN-HYDROCHLOROTHIAZIDE 160-12.5 MG PO TABS: 1.0000 | ORAL_TABLET | Freq: Every day | ORAL | 0 refills | Status: DC

## 2023-08-24 ENCOUNTER — Telehealth: Payer: Self-pay | Admitting: Family Medicine

## 2023-08-24 DIAGNOSIS — Z1331 Encounter for screening for depression: Secondary | ICD-10-CM

## 2023-08-24 DIAGNOSIS — I10 Essential (primary) hypertension: Secondary | ICD-10-CM

## 2023-08-24 NOTE — Telephone Encounter (Signed)
 Orders was sent from [facility] and needs signatures and sent back to its designated place. Paper work is placed in Chartered loss adjuster.  Please advise, Thanks

## 2023-08-25 MED ORDER — FLUOXETINE HCL 40 MG PO CAPS: 40.0000 mg | ORAL_CAPSULE | Freq: Every day | ORAL | 4 refills | Status: DC

## 2023-08-25 MED ORDER — ATORVASTATIN CALCIUM 20 MG PO TABS: 20.0000 mg | ORAL_TABLET | Freq: Every day | ORAL | 6 refills | Status: DC

## 2023-08-25 MED ORDER — BUPROPION HCL ER (XL) 300 MG PO TB24: 300.0000 mg | ORAL_TABLET | Freq: Every day | ORAL | 4 refills | Status: DC

## 2023-08-25 NOTE — Telephone Encounter (Signed)
 Taken care of

## 2023-10-23 ENCOUNTER — Other Ambulatory Visit: Payer: Self-pay | Admitting: Cardiology

## 2023-10-23 NOTE — Telephone Encounter (Signed)
 Rx refill sent to pharmacy.

## 2023-11-28 ENCOUNTER — Other Ambulatory Visit: Payer: Self-pay | Admitting: Family Medicine

## 2023-11-28 DIAGNOSIS — Z1331 Encounter for screening for depression: Secondary | ICD-10-CM

## 2023-11-29 ENCOUNTER — Encounter: Payer: Self-pay | Admitting: Obstetrics and Gynecology

## 2023-12-08 ENCOUNTER — Other Ambulatory Visit: Payer: Self-pay | Admitting: Family Medicine

## 2023-12-08 DIAGNOSIS — I10 Essential (primary) hypertension: Secondary | ICD-10-CM

## 2023-12-14 ENCOUNTER — Other Ambulatory Visit: Payer: Self-pay | Admitting: Cardiology

## 2023-12-25 ENCOUNTER — Other Ambulatory Visit: Payer: Self-pay | Admitting: Family Medicine

## 2023-12-25 DIAGNOSIS — Z1331 Encounter for screening for depression: Secondary | ICD-10-CM

## 2023-12-26 NOTE — Telephone Encounter (Signed)
 Pt needs appt scheduled Will call pt to schedule this today

## 2023-12-26 NOTE — Telephone Encounter (Signed)
Lvm to call office to schedule an appt

## 2023-12-27 ENCOUNTER — Ambulatory Visit: Payer: Self-pay | Admitting: Family Medicine

## 2023-12-27 ENCOUNTER — Encounter: Payer: Self-pay | Admitting: Family Medicine

## 2023-12-27 ENCOUNTER — Ambulatory Visit (INDEPENDENT_AMBULATORY_CARE_PROVIDER_SITE_OTHER): Admitting: Family Medicine

## 2023-12-27 VITALS — BP 98/62 | HR 73 | Temp 97.7°F | Resp 15 | Ht 64.0 in | Wt 195.4 lb

## 2023-12-27 DIAGNOSIS — R7303 Prediabetes: Secondary | ICD-10-CM

## 2023-12-27 DIAGNOSIS — E782 Mixed hyperlipidemia: Secondary | ICD-10-CM | POA: Diagnosis not present

## 2023-12-27 DIAGNOSIS — I10 Essential (primary) hypertension: Secondary | ICD-10-CM | POA: Diagnosis not present

## 2023-12-27 DIAGNOSIS — K219 Gastro-esophageal reflux disease without esophagitis: Secondary | ICD-10-CM

## 2023-12-27 DIAGNOSIS — E669 Obesity, unspecified: Secondary | ICD-10-CM

## 2023-12-27 LAB — BASIC METABOLIC PANEL WITH GFR
BUN: 17 mg/dL (ref 6–23)
CO2: 29 meq/L (ref 19–32)
Calcium: 8.9 mg/dL (ref 8.4–10.5)
Chloride: 105 meq/L (ref 96–112)
Creatinine, Ser: 0.83 mg/dL (ref 0.40–1.20)
GFR: 79.69 mL/min (ref >60.00)
Glucose, Bld: 134 mg/dL — ABNORMAL HIGH (ref 70–99)
Potassium: 4.2 meq/L (ref 3.5–5.1)
Sodium: 142 meq/L (ref 135–145)

## 2023-12-27 LAB — CBC WITH DIFFERENTIAL/PLATELET
Basophils Absolute: 0 K/uL (ref 0.0–0.1)
Basophils Relative: 0.8 % (ref 0.0–3.0)
Eosinophils Absolute: 0.2 K/uL (ref 0.0–0.7)
Eosinophils Relative: 3.7 % (ref 0.0–5.0)
HCT: 41.8 % (ref 36.0–46.0)
Hemoglobin: 14 g/dL (ref 12.0–15.0)
Lymphocytes Relative: 25 % (ref 12.0–46.0)
Lymphs Abs: 1.5 K/uL (ref 0.7–4.0)
MCHC: 33.4 g/dL (ref 30.0–36.0)
MCV: 94.7 fl (ref 78.0–100.0)
Monocytes Absolute: 0.3 K/uL (ref 0.1–1.0)
Monocytes Relative: 5.1 % (ref 3.0–12.0)
Neutro Abs: 3.9 K/uL (ref 1.4–7.7)
Neutrophils Relative %: 65.4 % (ref 43.0–77.0)
Platelets: 198 K/uL (ref 150.0–400.0)
RBC: 4.41 Mil/uL (ref 3.87–5.11)
RDW: 13.3 % (ref 11.5–15.5)
WBC: 5.9 K/uL (ref 4.0–10.5)

## 2023-12-27 LAB — LIPID PANEL
Cholesterol: 120 mg/dL (ref 0–200)
HDL: 50.1 mg/dL (ref >39.00)
LDL Cholesterol: 51 mg/dL (ref 0–99)
NonHDL: 69.83
Total CHOL/HDL Ratio: 2
Triglycerides: 96 mg/dL (ref 0.0–149.0)
VLDL: 19.2 mg/dL (ref 0.0–40.0)

## 2023-12-27 LAB — TSH: TSH: 1.68 u[IU]/mL (ref 0.35–5.50)

## 2023-12-27 LAB — HEMOGLOBIN A1C: Hgb A1c MFr Bld: 6.1 % (ref 4.6–6.5)

## 2023-12-27 LAB — HEPATIC FUNCTION PANEL
ALT: 17 U/L (ref 0–35)
AST: 15 U/L (ref 0–37)
Albumin: 4 g/dL (ref 3.5–5.2)
Alkaline Phosphatase: 53 U/L (ref 39–117)
Bilirubin, Direct: 0.1 mg/dL (ref 0.0–0.3)
Total Bilirubin: 0.5 mg/dL (ref 0.2–1.2)
Total Protein: 6.3 g/dL (ref 6.0–8.3)

## 2023-12-27 MED ORDER — PANTOPRAZOLE SODIUM 40 MG PO TBEC: 40.0000 mg | DELAYED_RELEASE_TABLET | Freq: Every day | ORAL | 3 refills | Status: AC

## 2023-12-27 NOTE — Assessment & Plan Note (Signed)
Chronic problem.  On Lipitor w/o difficulty.  Check labs.  Adjust meds prn

## 2023-12-27 NOTE — Assessment & Plan Note (Signed)
 Ongoing issue for pt.  Current BMI 33.54  She is not exercising regularly or following particular diet.  Encouraged low carb diet and regular physical activity.  Will follow.

## 2023-12-27 NOTE — Assessment & Plan Note (Signed)
 Deteriorated.  Pt reports ongoing hoarseness x1 yr despite 2 separate ENT evaluations.  Does note some reflux sxs.  Will start PPI and monitor for improvement.  Pt expressed understanding and is in agreement w/ plan.

## 2023-12-27 NOTE — Progress Notes (Signed)
   Subjective:    Patient ID: Sara Hunter, female    DOB: March 06, 1971, 53 y.o.   MRN: [id number]  HPI HTN- chronic problem, on Valsartan  hydrochlorothiazide  160/12.5mg  daily, Bystolic  5mg  daily w/ excellent control.  No CP, SOB, HA's, edema.  Hyperlipidemia- chronic problem, on Lipitor 20mg  daily.  No abd pain, N/V.  Since going to the beach in late June has had soft or runny stools.  Prediabetes- A1C has been as high as 6.1%.  Last check was 5.8%  Obesity- ongoing issue for pt.  BMI now 33.54  No regular exercise  Hoarseness- lost voice last summer and it has never returned to baseline.  Has seen 2 separate ENT and both say things look 'fine'.  Continues to struggle w/ weak, raspy voice.  Does report some reflux.     Review of Systems For ROS see HPI     Objective:   Physical Exam Vitals reviewed.  Constitutional:      General: She is not in acute distress.    Appearance: Normal appearance. She is well-developed. She is obese. She is not ill-appearing.  HENT:     Head: Normocephalic and atraumatic.  Eyes:     Conjunctiva/sclera: Conjunctivae normal.     Pupils: Pupils are equal, round, and reactive to light.  Neck:     Thyroid : No thyromegaly.  Cardiovascular:     Rate and Rhythm: Normal rate and regular rhythm.     Pulses: Normal pulses.     Heart sounds: Normal heart sounds. No murmur heard. Pulmonary:     Effort: Pulmonary effort is normal. No respiratory distress.     Breath sounds: Normal breath sounds.  Abdominal:     General: There is no distension.     Palpations: Abdomen is soft.     Tenderness: There is no abdominal tenderness.  Musculoskeletal:     Cervical back: Normal range of motion and neck supple.     Right lower leg: No edema.     Left lower leg: No edema.  Lymphadenopathy:     Cervical: No cervical adenopathy.  Skin:    General: Skin is warm and dry.  Neurological:     General: No focal deficit present.     Mental Status: She is alert and  oriented to person, place, and time.  Psychiatric:        Mood and Affect: Mood normal.        Behavior: Behavior normal.        Thought Content: Thought content normal.           Assessment & Plan:

## 2023-12-27 NOTE — Assessment & Plan Note (Signed)
 Chronic problem.  Currently well controlled on Valsartan  hydrochlorothiazide  and Bystolic .  Currently asymptomatic.  Check labs due to ARB and diuretic use but no anticipated med changes.  Will follow.

## 2023-12-27 NOTE — Assessment & Plan Note (Signed)
 Ongoing issue.  Highest A1C was 6.1%  Discussed the importance of healthy diet and regular exercise.  Check labs and continue to follow.

## 2023-12-27 NOTE — Patient Instructions (Signed)
 Schedule your complete physical in 6 months We'll notify you of your lab results and make any changes if needed Continue to work on healthy diet and regular exercise- you can do it! ADD an OTC Probiotic to see if stools normalize START the Pantoprazole  (acid reducer) daily to see if voice improves WEAR your glasses regularly and if vision doesn't improve, call eye doctor to see about cataracts Call with any questions or concerns Stay Safe!  Stay Healthy! Hang in there!!!

## 2024-01-01 ENCOUNTER — Encounter

## 2024-01-01 ENCOUNTER — Other Ambulatory Visit

## 2024-01-07 ENCOUNTER — Other Ambulatory Visit: Payer: Self-pay | Admitting: Cardiology

## 2024-01-09 ENCOUNTER — Ambulatory Visit: Admission: RE | Admit: 2024-01-09 | Source: Ambulatory Visit

## 2024-01-09 ENCOUNTER — Ambulatory Visit
Admission: RE | Admit: 2024-01-09 | Discharge: 2024-01-09 | Disposition: A | Discharge status: Home / Self Care | Source: Ambulatory Visit | Attending: Obstetrics and Gynecology

## 2024-01-09 DIAGNOSIS — N6310 Unspecified lump in the right breast, unspecified quadrant: Secondary | ICD-10-CM

## 2024-01-10 ENCOUNTER — Other Ambulatory Visit: Payer: Self-pay | Admitting: Obstetrics and Gynecology

## 2024-01-10 DIAGNOSIS — N6489 Other specified disorders of breast: Secondary | ICD-10-CM

## 2024-02-13 ENCOUNTER — Other Ambulatory Visit: Payer: Self-pay | Admitting: Cardiology

## 2024-02-27 ENCOUNTER — Other Ambulatory Visit: Payer: Self-pay | Admitting: Cardiology

## 2024-04-09 ENCOUNTER — Other Ambulatory Visit: Payer: Self-pay | Admitting: Urology

## 2024-04-11 ENCOUNTER — Encounter (HOSPITAL_COMMUNITY): Payer: Self-pay | Admitting: Urology

## 2024-04-11 NOTE — Progress Notes (Signed)
 Attempted to obtain medical history for pre op call via telephone, unable to reach at this time. HIPAA compliant voicemail message left requesting return call to pre surgical testing department.

## 2024-04-12 NOTE — Progress Notes (Signed)
 Attempted to obtain medical history for pre op call via telephone, unable to reach at this time. HIPAA compliant voicemail message left requesting return call to pre surgical testing department.

## 2024-04-14 NOTE — Anesthesia Preprocedure Evaluation (Signed)
"                                    Anesthesia Evaluation  Patient identified by MRN, date of birth, ID band Patient awake    Reviewed: Allergy & Precautions, NPO status , Patient's Chart, lab work & pertinent test results  Airway Mallampati: III  TM Distance: >3 FB Neck ROM: Full    Dental  (+) Dental Advisory Given, Teeth Intact   Pulmonary neg recent URI, Current Smoker and Patient abstained from smoking.   Pulmonary exam normal breath sounds clear to auscultation       Cardiovascular hypertension, Pt. on home beta blockers and Pt. on medications Normal cardiovascular exam Rhythm:Regular Rate:Normal     Neuro/Psych  PSYCHIATRIC DISORDERS Anxiety Depression    negative neurological ROS     GI/Hepatic Neg liver ROS,GERD  Medicated and Controlled,,  Endo/Other  negative endocrine ROS    Renal/GU negative Renal ROS     Musculoskeletal negative musculoskeletal ROS (+)    Abdominal  (+) + obese  Peds  Hematology negative hematology ROS (+)   Anesthesia Other Findings   Reproductive/Obstetrics                              Anesthesia Physical Anesthesia Plan  ASA: 2  Anesthesia Plan: MAC   Post-op Pain Management: Tylenol  PO (pre-op)*   Induction: Intravenous  PONV Risk Score and Plan: 2 and Ondansetron, Dexamethasone, Treatment may vary due to age or medical condition, Midazolam , TIVA and Propofol  infusion  Airway Management Planned: Natural Airway  Additional Equipment:   Intra-op Plan:   Post-operative Plan:   Informed Consent: I have reviewed the patients History and Physical, chart, labs and discussed the procedure including the risks, benefits and alternatives for the proposed anesthesia with the patient or authorized representative who has indicated his/her understanding and acceptance.     Dental advisory given  Plan Discussed with: CRNA  Anesthesia Plan Comments:          Anesthesia Quick  Evaluation  "

## 2024-04-15 ENCOUNTER — Encounter (HOSPITAL_COMMUNITY): Admission: RE | Disposition: A | Payer: Self-pay | Source: Home / Self Care | Attending: Urology

## 2024-04-15 ENCOUNTER — Ambulatory Visit (HOSPITAL_COMMUNITY)
Admission: RE | Admit: 2024-04-15 | Discharge: 2024-04-15 | Disposition: A | Discharge status: Home / Self Care | Attending: Urology | Admitting: Urology

## 2024-04-15 ENCOUNTER — Ambulatory Visit (HOSPITAL_BASED_OUTPATIENT_CLINIC_OR_DEPARTMENT_OTHER): Admitting: Certified Registered Nurse Anesthetist

## 2024-04-15 ENCOUNTER — Other Ambulatory Visit: Payer: Self-pay

## 2024-04-15 ENCOUNTER — Ambulatory Visit (HOSPITAL_COMMUNITY)

## 2024-04-15 ENCOUNTER — Encounter (HOSPITAL_COMMUNITY): Payer: Self-pay | Admitting: Urology

## 2024-04-15 ENCOUNTER — Encounter (HOSPITAL_COMMUNITY): Admitting: Certified Registered Nurse Anesthetist

## 2024-04-15 DIAGNOSIS — E669 Obesity, unspecified: Secondary | ICD-10-CM | POA: Insufficient documentation

## 2024-04-15 DIAGNOSIS — Z7901 Long term (current) use of anticoagulants: Secondary | ICD-10-CM | POA: Diagnosis not present

## 2024-04-15 DIAGNOSIS — Z79899 Other long term (current) drug therapy: Secondary | ICD-10-CM | POA: Diagnosis not present

## 2024-04-15 DIAGNOSIS — K219 Gastro-esophageal reflux disease without esophagitis: Secondary | ICD-10-CM | POA: Diagnosis not present

## 2024-04-15 DIAGNOSIS — F418 Other specified anxiety disorders: Secondary | ICD-10-CM

## 2024-04-15 DIAGNOSIS — N201 Calculus of ureter: Secondary | ICD-10-CM

## 2024-04-15 DIAGNOSIS — Z6833 Body mass index (BMI) 33.0-33.9, adult: Secondary | ICD-10-CM | POA: Diagnosis not present

## 2024-04-15 DIAGNOSIS — I10 Essential (primary) hypertension: Secondary | ICD-10-CM | POA: Insufficient documentation

## 2024-04-15 DIAGNOSIS — F17200 Nicotine dependence, unspecified, uncomplicated: Secondary | ICD-10-CM

## 2024-04-15 HISTORY — PX: EXTRACORPOREAL SHOCK WAVE LITHOTRIPSY: SHX1557

## 2024-04-15 SURGERY — LITHOTRIPSY, ESWL
Anesthesia: Monitor Anesthesia Care | Laterality: Left

## 2024-04-15 MED ORDER — FENTANYL CITRATE (PF) 100 MCG/2ML IJ SOLN
INTRAMUSCULAR | Status: AC
  Filled 2024-04-15: qty 2

## 2024-04-15 MED ORDER — MIDAZOLAM HCL (PF) 2 MG/2ML IJ SOLN
INTRAMUSCULAR | Status: DC | PRN
  Administered 2024-04-15: 2 mg via INTRAVENOUS

## 2024-04-15 MED ORDER — OXYCODONE HCL 5 MG PO TABS: 5.0000 mg | ORAL_TABLET | Freq: Once | ORAL | Status: DC | PRN

## 2024-04-15 MED ORDER — MIDAZOLAM HCL 2 MG/2ML IJ SOLN
INTRAMUSCULAR | Status: AC
  Filled 2024-04-15: qty 2

## 2024-04-15 MED ORDER — CIPROFLOXACIN HCL 500 MG PO TABS
500.0000 mg | ORAL_TABLET | ORAL | Status: AC
  Administered 2024-04-15: 500 mg via ORAL
  Filled 2024-04-15: qty 1

## 2024-04-15 MED ORDER — PROPOFOL 10 MG/ML IV BOLUS
INTRAVENOUS | Status: AC
  Filled 2024-04-15: qty 20

## 2024-04-15 MED ORDER — PROPOFOL 10 MG/ML IV BOLUS
INTRAVENOUS | Status: DC | PRN
  Administered 2024-04-15: 20 mg via INTRAVENOUS
  Administered 2024-04-15 (×2): 30 mg via INTRAVENOUS

## 2024-04-15 MED ORDER — PROPOFOL 1000 MG/100ML IV EMUL
INTRAVENOUS | Status: AC
  Filled 2024-04-15: qty 100

## 2024-04-15 MED ORDER — DROPERIDOL 2.5 MG/ML IJ SOLN: 0.6250 mg | Freq: Once | INTRAMUSCULAR | Status: DC | PRN

## 2024-04-15 MED ORDER — DIAZEPAM 5 MG PO TABS: 10.0000 mg | ORAL_TABLET | ORAL | Status: DC

## 2024-04-15 MED ORDER — OXYCODONE HCL 5 MG/5ML PO SOLN: 5.0000 mg | Freq: Once | ORAL | Status: DC | PRN

## 2024-04-15 MED ORDER — ACETAMINOPHEN 500 MG PO TABS
1000.0000 mg | ORAL_TABLET | Freq: Once | ORAL | Status: AC
  Administered 2024-04-15: 1000 mg via ORAL
  Filled 2024-04-15: qty 2

## 2024-04-15 MED ORDER — KETOROLAC TROMETHAMINE 15 MG/ML IJ SOLN
15.0000 mg | Freq: Once | INTRAMUSCULAR | Status: AC
  Administered 2024-04-15: 15 mg via INTRAVENOUS

## 2024-04-15 MED ORDER — TAMSULOSIN HCL 0.4 MG PO CAPS: 0.4000 mg | ORAL_CAPSULE | Freq: Every day | ORAL | 0 refills | Status: AC

## 2024-04-15 MED ORDER — DIPHENHYDRAMINE HCL 25 MG PO CAPS: 25.0000 mg | ORAL_CAPSULE | ORAL | Status: DC

## 2024-04-15 MED ORDER — FENTANYL CITRATE (PF) 50 MCG/ML IJ SOSY: 25.0000 ug | PREFILLED_SYRINGE | INTRAMUSCULAR | Status: DC | PRN

## 2024-04-15 MED ORDER — FENTANYL CITRATE (PF) 100 MCG/2ML IJ SOLN
INTRAMUSCULAR | Status: DC | PRN
  Administered 2024-04-15 (×2): 50 ug via INTRAVENOUS

## 2024-04-15 MED ORDER — KETOROLAC TROMETHAMINE 10 MG PO TABS: 10.0000 mg | ORAL_TABLET | Freq: Three times a day (TID) | ORAL | 0 refills | Status: AC | PRN

## 2024-04-15 MED ORDER — PROPOFOL 1000 MG/100ML IV EMUL
INTRAVENOUS | Status: AC
  Filled 2024-04-15: qty 200

## 2024-04-15 MED ORDER — KETOROLAC TROMETHAMINE 15 MG/ML IJ SOLN
INTRAMUSCULAR | Status: AC
  Filled 2024-04-15: qty 1

## 2024-04-15 MED ORDER — PROPOFOL 500 MG/50ML IV EMUL
INTRAVENOUS | Status: DC | PRN
  Administered 2024-04-15: 100 ug/kg/min via INTRAVENOUS

## 2024-04-15 MED ORDER — SODIUM CHLORIDE 0.9 % IV SOLN: INTRAVENOUS | Status: DC

## 2024-04-15 NOTE — Discharge Instructions (Addendum)
 1. You should strain your urine and collect all fragments and bring them to your follow up appointment.  2. You should take your pain medication as needed.  Please call if your pain is severe to the point that it is not controlled with your pain medication. 3. You should call if you develop fever > 101 or persistent nausea or vomiting. 4. Your doctor may prescribe tamsulosin  to take to help facilitate stone passage.

## 2024-04-15 NOTE — Progress Notes (Signed)
 Short Stay / Litho Nurse Phone Call: Attempted to reach client to notify of 0600hrs arrival for Monday Litho Tx. No answer/ No voicemail availability. Unable to reach.

## 2024-04-15 NOTE — Transfer of Care (Addendum)
 Immediate Anesthesia Transfer of Care Note  Patient: Sara Hunter  Procedure(s) Performed: LITHOTRIPSY, ESWL (Left)  Patient Location: Short Stay  Anesthesia Type:MAC  Level of Consciousness: awake, alert , and oriented  Airway & Oxygen Therapy: Patient Spontanous Breathing  Post-op Assessment: Report given to RN, Post -op Vital signs reviewed and stable, and Patient moving all extremities X 4  Post vital signs: Reviewed and stable  Last Vitals: see pACU  charting Vitals Value Taken Time  BP    Temp    Pulse    Resp    SpO2      Last Pain:  Vitals:   04/15/24 0813  TempSrc:   PainSc: 0-No pain         Complications: No notable events documented.

## 2024-04-15 NOTE — Interval H&P Note (Signed)
 History and Physical Interval Note:  04/15/2024 7:46 AM  Sara Hunter  has presented today for surgery, with the diagnosis of LEFT URETERAL STONE.  The various methods of treatment have been discussed with the patient and family. After consideration of risks, benefits and other options for treatment, the patient has consented to  Procedures with comments: LITHOTRIPSY, ESWL (Left) - LEFT EXTRACORPOREAL SHOCKWAVE LITHOTRIPSY as a surgical intervention.  The patient's history has been reviewed, patient examined, no change in status, stable for surgery.  I have reviewed the patient's chart and labs.  Questions were answered to the patient's satisfaction.    Due to chronic naltrexone  use, she is to be sedated with propfol with anesthesia involvement as opposed to routine IV sedation/narcotics.   [employer]

## 2024-04-15 NOTE — Op Note (Signed)
 See [employer] operative note scanned into chart. Also because of the size, density, location and other factors that cannot be anticipated I feel this will likely be a staged procedure. This fact supersedes any indication in the scanned [place] stone operative note to the contrary.

## 2024-04-16 ENCOUNTER — Encounter (HOSPITAL_COMMUNITY): Payer: Self-pay | Admitting: Urology

## 2024-04-16 NOTE — Anesthesia Postprocedure Evaluation (Signed)
"   Anesthesia Post Note  Patient: Sara Hunter  Procedure(s) Performed: LITHOTRIPSY, ESWL (Left)     Patient location during evaluation: PACU Anesthesia Type: MAC Level of consciousness: awake and alert Pain management: pain level controlled Vital Signs Assessment: post-procedure vital signs reviewed and stable Respiratory status: spontaneous breathing Cardiovascular status: stable Anesthetic complications: no   No notable events documented.  Last Vitals:  Vitals:   04/15/24 1032 04/15/24 1108  BP: (!) 132/93 132/76  Pulse: 67 (!) 57  Resp: 16 16  Temp: 36.9 C   SpO2: 98% 97%    Last Pain:  Vitals:   04/15/24 1108  TempSrc:   PainSc: 0-No pain                 Norleen Pope      "

## 2024-04-22 ENCOUNTER — Other Ambulatory Visit: Payer: Self-pay | Admitting: Cardiology

## 2024-05-28 ENCOUNTER — Other Ambulatory Visit: Payer: Self-pay | Admitting: Cardiology

## 2024-06-26 ENCOUNTER — Encounter: Admitting: Family Medicine
